# Patient Record
Sex: Male | Born: 1950 | Race: White | Hispanic: No | Marital: Married | Smoking: Former smoker
Health system: Southern US, Community
[De-identification: ages and names within clinical notes are randomized; demographics above are authoritative.]

## PROBLEM LIST (undated history)

## (undated) DIAGNOSIS — F32A Depression, unspecified: Secondary | ICD-10-CM

## (undated) DIAGNOSIS — C801 Malignant (primary) neoplasm, unspecified: Secondary | ICD-10-CM

## (undated) DIAGNOSIS — E119 Type 2 diabetes mellitus without complications: Secondary | ICD-10-CM

## (undated) DIAGNOSIS — Z8719 Personal history of other diseases of the digestive system: Secondary | ICD-10-CM

## (undated) DIAGNOSIS — F419 Anxiety disorder, unspecified: Secondary | ICD-10-CM

## (undated) DIAGNOSIS — I219 Acute myocardial infarction, unspecified: Secondary | ICD-10-CM

## (undated) DIAGNOSIS — G4730 Sleep apnea, unspecified: Secondary | ICD-10-CM

## (undated) DIAGNOSIS — G4733 Obstructive sleep apnea (adult) (pediatric): Secondary | ICD-10-CM

## (undated) DIAGNOSIS — I2510 Atherosclerotic heart disease of native coronary artery without angina pectoris: Secondary | ICD-10-CM

## (undated) DIAGNOSIS — K219 Gastro-esophageal reflux disease without esophagitis: Secondary | ICD-10-CM

## (undated) DIAGNOSIS — E039 Hypothyroidism, unspecified: Secondary | ICD-10-CM

## (undated) DIAGNOSIS — F329 Major depressive disorder, single episode, unspecified: Secondary | ICD-10-CM

## (undated) DIAGNOSIS — G709 Myoneural disorder, unspecified: Secondary | ICD-10-CM

## (undated) DIAGNOSIS — Z87442 Personal history of urinary calculi: Secondary | ICD-10-CM

## (undated) DIAGNOSIS — M1990 Unspecified osteoarthritis, unspecified site: Secondary | ICD-10-CM

## (undated) DIAGNOSIS — I10 Essential (primary) hypertension: Secondary | ICD-10-CM

## (undated) HISTORY — PX: NASAL SINUS SURGERY: SHX719

## (undated) HISTORY — PX: CORONARY ANGIOPLASTY: SHX604

## (undated) HISTORY — PX: EYE SURGERY: SHX253

## (undated) HISTORY — PX: CORONARY ANGIOPLASTY WITH STENT PLACEMENT: SHX49

---

## 2003-02-15 HISTORY — PX: THYROIDECTOMY, PARTIAL: SHX18

## 2011-02-15 DIAGNOSIS — G4730 Sleep apnea, unspecified: Secondary | ICD-10-CM

## 2011-02-15 HISTORY — DX: Sleep apnea, unspecified: G47.30

## 2015-02-15 HISTORY — PX: SHOULDER ARTHROSCOPY: SHX128

## 2015-02-15 HISTORY — PX: CARPAL TUNNEL RELEASE: SHX101

## 2016-05-12 ENCOUNTER — Other Ambulatory Visit: Payer: Self-pay | Admitting: Neurosurgery

## 2016-05-12 DIAGNOSIS — M5412 Radiculopathy, cervical region: Secondary | ICD-10-CM

## 2016-05-17 ENCOUNTER — Ambulatory Visit
Admission: RE | Admit: 2016-05-17 | Discharge: 2016-05-17 | Disposition: A | Discharge status: Home / Self Care | Payer: Medicare Other | Source: Ambulatory Visit | Attending: Neurosurgery | Admitting: Neurosurgery

## 2016-05-17 DIAGNOSIS — M5412 Radiculopathy, cervical region: Secondary | ICD-10-CM

## 2016-06-07 ENCOUNTER — Other Ambulatory Visit: Payer: Self-pay | Admitting: Neurosurgery

## 2016-06-15 ENCOUNTER — Encounter (HOSPITAL_COMMUNITY)
Admission: RE | Admit: 2016-06-15 | Discharge: 2016-06-15 | Disposition: A | Discharge status: Home / Self Care | Payer: Medicare Other | Source: Ambulatory Visit | Attending: Neurosurgery | Admitting: Neurosurgery

## 2016-06-15 ENCOUNTER — Encounter (HOSPITAL_COMMUNITY): Payer: Self-pay

## 2016-06-15 DIAGNOSIS — Z87442 Personal history of urinary calculi: Secondary | ICD-10-CM | POA: Diagnosis not present

## 2016-06-15 DIAGNOSIS — I2510 Atherosclerotic heart disease of native coronary artery without angina pectoris: Secondary | ICD-10-CM | POA: Diagnosis not present

## 2016-06-15 DIAGNOSIS — G4730 Sleep apnea, unspecified: Secondary | ICD-10-CM | POA: Insufficient documentation

## 2016-06-15 DIAGNOSIS — G709 Myoneural disorder, unspecified: Secondary | ICD-10-CM | POA: Insufficient documentation

## 2016-06-15 DIAGNOSIS — I252 Old myocardial infarction: Secondary | ICD-10-CM | POA: Diagnosis not present

## 2016-06-15 DIAGNOSIS — F419 Anxiety disorder, unspecified: Secondary | ICD-10-CM | POA: Insufficient documentation

## 2016-06-15 DIAGNOSIS — Z01812 Encounter for preprocedural laboratory examination: Secondary | ICD-10-CM | POA: Insufficient documentation

## 2016-06-15 DIAGNOSIS — I10 Essential (primary) hypertension: Secondary | ICD-10-CM | POA: Insufficient documentation

## 2016-06-15 DIAGNOSIS — F329 Major depressive disorder, single episode, unspecified: Secondary | ICD-10-CM | POA: Insufficient documentation

## 2016-06-15 DIAGNOSIS — K219 Gastro-esophageal reflux disease without esophagitis: Secondary | ICD-10-CM | POA: Insufficient documentation

## 2016-06-15 HISTORY — DX: Essential (primary) hypertension: I10

## 2016-06-15 HISTORY — DX: Unspecified osteoarthritis, unspecified site: M19.90

## 2016-06-15 HISTORY — DX: Gastro-esophageal reflux disease without esophagitis: K21.9

## 2016-06-15 HISTORY — DX: Major depressive disorder, single episode, unspecified: F32.9

## 2016-06-15 HISTORY — DX: Sleep apnea, unspecified: G47.30

## 2016-06-15 HISTORY — DX: Personal history of other diseases of the digestive system: Z87.19

## 2016-06-15 HISTORY — DX: Atherosclerotic heart disease of native coronary artery without angina pectoris: I25.10

## 2016-06-15 HISTORY — DX: Depression, unspecified: F32.A

## 2016-06-15 HISTORY — DX: Myoneural disorder, unspecified: G70.9

## 2016-06-15 HISTORY — DX: Anxiety disorder, unspecified: F41.9

## 2016-06-15 HISTORY — DX: Personal history of urinary calculi: Z87.442

## 2016-06-15 HISTORY — DX: Acute myocardial infarction, unspecified: I21.9

## 2016-06-15 LAB — CBC
HCT: 40 % (ref 39.0–52.0)
Hemoglobin: 13.4 g/dL (ref 13.0–17.0)
MCH: 29.1 pg (ref 26.0–34.0)
MCHC: 33.5 g/dL (ref 30.0–36.0)
MCV: 87 fL (ref 78.0–100.0)
PLATELETS: 211 10*3/uL (ref 150–400)
RBC: 4.6 MIL/uL (ref 4.22–5.81)
RDW: 13.4 % (ref 11.5–15.5)
WBC: 11 10*3/uL — AB (ref 4.0–10.5)

## 2016-06-15 LAB — SURGICAL PCR SCREEN
MRSA, PCR: NEGATIVE
STAPHYLOCOCCUS AUREUS: NEGATIVE

## 2016-06-15 LAB — TYPE AND SCREEN
ABO/RH(D): A POS
Antibody Screen: NEGATIVE

## 2016-06-15 LAB — BASIC METABOLIC PANEL
Anion gap: 8 (ref 5–15)
BUN: 17 mg/dL (ref 6–20)
CALCIUM: 9.3 mg/dL (ref 8.9–10.3)
CHLORIDE: 104 mmol/L (ref 101–111)
CO2: 23 mmol/L (ref 22–32)
CREATININE: 0.81 mg/dL (ref 0.61–1.24)
GFR calc Af Amer: >60 mL/min (ref >60)
Glucose, Bld: 119 mg/dL — ABNORMAL HIGH (ref 65–99)
Potassium: 3.9 mmol/L (ref 3.5–5.1)
SODIUM: 135 mmol/L (ref 135–145)

## 2016-06-15 LAB — GLUCOSE, CAPILLARY: Glucose-Capillary: 154 mg/dL — ABNORMAL HIGH (ref 65–99)

## 2016-06-15 LAB — ABO/RH: ABO/RH(D): A POS

## 2016-06-15 NOTE — Pre-Procedure Instructions (Signed)
Douglas Aguirre  06/15/2016      [facility] - [place], [place] - [address] [place] [address] Phone: [phone] Fax: [phone]    Your procedure is scheduled on 06/21/2016- TUESDAY  Report to [facility] Admitting at 11:30 A.M.  Call this number if you have problems the morning of surgery:  [phone]   STOP GOODY'S POWDER, follow instructions for holding Plavix     Remember:   Do not eat food or drink liquids after midnight.  On Monday   Take these medicines the morning of surgery with A SIP OF WATER :PRILOSEC, Levothryroxine, Carvedilol, Sertraline & may take pain medicine if needed    Do not wear jewelry   Do not wear lotions, powders, or perfumes, or deoderant.              Men may shave face and neck.   Do not bring valuables to the hospital.   [facility] is not responsible for any belongings or valuables.  Contacts, dentures or bridgework may not be worn into surgery.  Leave your suitcase in the car.  After surgery it may be brought to your room.  For patients admitted to the hospital, discharge time will be determined by your treatment team.  Patients discharged the day of surgery will not be allowed to drive home.   Name and phone number of your driver:   Wife   Special instructions:     How to Manage Your Diabetes Before and After Surgery  Why is it important to control my blood sugar before and after surgery? . Improving blood sugar levels before and after surgery helps healing and can limit problems. . A way of improving blood sugar control is eating a healthy diet by: o  Eating less sugar and carbohydrates o  Increasing activity/exercise o  Talking with your doctor about reaching your blood sugar goals . High blood sugars (greater than 180 mg/dL) can raise your risk of infections and slow your recovery, so you will need to focus on controlling your diabetes during the weeks before surgery. . Make  sure that the doctor who takes care of your diabetes knows about your planned surgery including the date and location.  How do I manage my blood sugar before surgery? . Check your blood sugar at least 4 times a day, starting 2 days before surgery, to make sure that the level is not too high or low. o Check your blood sugar the morning of your surgery when you wake up and every 2 hours until you get to the Short Stay unit. . If your blood sugar is less than 70 mg/dL, you will need to treat for low blood sugar: o Do not take insulin. o Treat a low blood sugar (less than 70 mg/dL) with  cup of clear juice (cranberry or apple), 4 glucose tablets, OR glucose gel. o Recheck blood sugar in 15 minutes after treatment (to make sure it is greater than 70 mg/dL). If your blood sugar is not greater than 70 mg/dL on recheck, call [phone] for further instructions. . Report your blood sugar to the short stay nurse when you get to Short Stay.  . If you are admitted to the hospital after surgery: o Your blood sugar will be checked by the staff and you will probably be given insulin after surgery (instead of oral diabetes medicines) to make sure you have good blood sugar levels. o The goal  for blood sugar control after surgery is 80-180 mg/dL.              WHAT DO I DO ABOUT MY DIABETES MEDICATION?  DO NOT TAKE EVENING DOSE OF Glimerpiride on 06/20/2016.  . Do not take oral diabetes medicines (pills) the morning of surgery.  . .       . HE MORNING OF SURGERY, take __50___________ units of _Levemir_________insulin.  . The day of surgery, do not take other diabetes injectables, including Byetta (exenatide), Bydureon (exenatide ER), Victoza (liraglutide), or Trulicity (dulaglutide).  . If your CBG is greater than 220 mg/dL, you may take  of your sliding scale (correction) dose of insulin.  Other Instructions:  Special Instructions: [place] - Preparing for Surgery  Before surgery, you  can play an important role.  Because skin is not sterile, your skin needs to be as free of germs as possible.  You can reduce the number of germs on you skin by washing with CHG (chlorahexidine gluconate) soap before surgery.  CHG is an antiseptic cleaner which kills germs and bonds with the skin to continue killing germs even after washing.  Please DO NOT use if you have an allergy to CHG or antibacterial soaps.  If your skin becomes reddened/irritated stop using the CHG and inform your nurse when you arrive at Short Stay.  Do not shave (including legs and underarms) for at least 48 hours prior to the first CHG shower.  You may shave your face.  Please follow these instructions carefully:   1.  Shower with CHG Soap the night before surgery and the  morning of Surgery.  2.  If you choose to wash your hair, wash your hair first as usual with your  normal shampoo.  3.  After you shampoo, rinse your hair and body thoroughly to remove the  Shampoo.  4.  Use CHG as you would any other liquid soap.  You can apply chg directly to the skin and wash gently with scrungie or a clean washcloth.  5.  Apply the CHG Soap to your body ONLY FROM THE NECK DOWN.    Do not use on open wounds or open sores.  Avoid contact with your eyes, ears, mouth and genitals (private parts).  Wash genitals (private parts)   with your normal soap.  6.  Wash thoroughly, paying special attention to the area where your surgery will be performed.  7.  Thoroughly rinse your body with warm water from the neck down.  8.  DO NOT shower/wash with your normal soap after using and rinsing off   the CHG Soap.  9.  Pat yourself dry with a clean towel.            10.  Wear clean pajamas.            11.  Place clean sheets on your bed the night of your first shower and do not sleep with pets.  Day of Surgery  Do not apply any lotions/deodorants the morning of surgery.  Please wear clean clothes to the hospital/surgery  center.          Patient Signature:  Date:   Nurse Signature:  Date:   Reviewed and Endorsed by [facility] Patient Education Committee, August 2015  Please read over the following fact sheets that you were given. Pain Booklet, Coughing and Deep Breathing, MRSA Information and Surgical Site Infection Prevention

## 2016-06-15 NOTE — Progress Notes (Signed)
Last OV- Dr. Betsy Pries- cardio- 08/2015 & he has remarked that he doesn't need to be seen again  before surgery.  Have faxed a request to Dr. Betsy Pries to forward last EKG, Stress/echo & surgical clearnace. Pt. Also see Dr. Byrd Hesselbach in [place] for PCP.  Pt. Reports that aside from his pain in his hands, L arm ( same as what it has been )  & neck, he has no complaints; denies all chest complaints.

## 2016-06-16 LAB — HEMOGLOBIN A1C
HEMOGLOBIN A1C: 9.4 % — AB (ref 4.8–5.6)
Mean Plasma Glucose: 223 mg/dL

## 2016-06-16 NOTE — Progress Notes (Addendum)
Anesthesia Chart Review: Patient is a 66 year old male scheduled for C6-7 ACDF, left carpal tunnel release on 06/21/2016 by Dr. Venetia MaxonStern. 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   - PCP is Dr. Adonis HugueninMichael Waters in [place], [place]. - Cardiologist is Dr. Daryel NovemberGary Miller at [facility]. Last visit 08/19/15. Dr. Hyacinth MeekerMiller was contacted for surgical clearance. At first there was question on whether or not he would need a stress test, but after speaking with patient and reviewing stress and echo for < 1 year ago, he approved surgery as scheduled--letter signed on 06/09/16. He did recommend that patient stay on ASA, but could hold Plavix for 7 days prior to surgery.   Meds include Xanax, aspirin 81 mg, Goody's extra strength, Coreg, Plavix, Lofibra, Amaryl, Norco, Levemir, levothyroxine, nitroglycerin, Prilosec, ramipril, Zoloft, Zocor, Janumet, trazodone, domperidone, Victoza.   BP (!) 105/52   Pulse 79   Temp 36.6 C   Resp 20   Ht 5\' 10"  (1.778 m)   Wt 251 lb 4.8 oz (114 kg)   SpO2 99%   BMI 36.06 kg/m   Last EKG received from Dr. Hyacinth MeekerMiller was from 08/15/14. We do not have the tracing from his 07/2015 stress test, so he will need an updated EKG on the day of surgery.  Nuclear stress test 07/20/15 (Dr. Hyacinth MeekerMiller): Impression: Negative electrical portion of the test. On the Cardiolite portion, there were no significant wall motion abnormality seen. The summed rest score was 2, the summed stress score was 0. There were no reversible ischemic perfusion defects. EF 46%. Normal LV contraction. This would be a low risk scan.   Echo 07/15/15 (Dr. Hyacinth MeekerMiller): Impressions: 1. LA is dilated. 2.  RV/LV or dilated. 3. Mild LVH. 4. EF 55-60%.  Cardiac Cath 09/22/06 ([facility]): Normal LV contractility with EF 60-65%. LM was normal. Proximal CX was normal.  OM1 was normal. Small distal CX had 75%.  This led into a small second circumflex marginal that had been seen on prior catheterization. Mid LAD now with 75% to 95%, increased for 25-50%. LAD had 25% stenosis further down and 25-50% in the mid to distal vessel. Proximal D1 had 25%. Ostial D2 25%. These were not large vessels. Additionally on the circumflex system, an obtuse marginal branch had 25% stenosis. Proximal RCA 25%. Mid RCA 25%. Distally the vessel had been stented in the posterolateral vessel. The stent appeared to be patent except in the midsegment of the stent there was a 75% restenosis.  PCI:  1. PCI/Xience DES was placed in the mid LAD. 2. PCI/Xience DES was placed in the previously placed stent in the distal RCA.   Preoperative labs. Cr 0.81. WBC 11.0. H/H 13.4/40.0. A1c 9.4, consistent with average glucose 223. T&S done. He reports home CBGs run ~ 150-170. We discussed that a fasting CBG > 200 could delay or cancel his surgery and that poorly controlled DM can place him at increased risk for post-operative infection. A1c result also called to Shanda BumpsJessica at Dr. Fredrich BirksStern's office--Dr. Venetia MaxonStern can contact patient if he has any concerns.   Patient continues to deny any CV symptoms. He confirmed that Plavix is now on hold. He will get a fasting CBG on arrival.  If no acute change and CBG result acceptable then I would anticipate that he could proceed as planned from an anesthesia standpoint.   Velna Ochs [facility] Short Stay Center/Anesthesiology Phone [phone] 06/17/2016 9:43 AM

## 2016-06-17 ENCOUNTER — Encounter (HOSPITAL_COMMUNITY): Payer: Self-pay

## 2016-06-20 ENCOUNTER — Encounter (HOSPITAL_COMMUNITY): Payer: Self-pay | Admitting: Anesthesiology

## 2016-06-20 NOTE — Anesthesia Preprocedure Evaluation (Addendum)
Anesthesia Evaluation  Patient identified by MRN, date of birth, ID band Patient awake    Reviewed: Allergy & Precautions, H&P , NPO status , Patient's Chart, lab work & pertinent test results  Airway Mallampati: III  TM Distance: >3 FB Neck ROM: Full    Dental no notable dental hx. (+) Upper Dentures, Edentulous Lower, Dental Advisory Given   Pulmonary sleep apnea and Continuous Positive Airway Pressure Ventilation , former smoker,    Pulmonary exam normal breath sounds clear to auscultation       Cardiovascular Exercise Tolerance: Good hypertension, Pt. on medications and Pt. on home beta blockers + CAD, + Past MI and + Cardiac Stents   Rhythm:Regular Rate:Normal     Neuro/Psych Anxiety Depression negative neurological ROS     GI/Hepatic Neg liver ROS, GERD  Medicated and Controlled,  Endo/Other  Morbid obesity  Renal/GU negative Renal ROS  negative genitourinary   Musculoskeletal  (+) Arthritis , Osteoarthritis,    Abdominal   Peds  Hematology negative hematology ROS (+)   Anesthesia Other Findings   Reproductive/Obstetrics negative OB ROS                            Anesthesia Physical Anesthesia Plan  ASA: III  Anesthesia Plan: General   Post-op Pain Management:    Induction: Intravenous  Airway Management Planned: Oral ETT  Additional Equipment:   Intra-op Plan:   Post-operative Plan: Extubation in OR  Informed Consent: I have reviewed the patients History and Physical, chart, labs and discussed the procedure including the risks, benefits and alternatives for the proposed anesthesia with the patient or authorized representative who has indicated his/her understanding and acceptance.   Dental advisory given  Plan Discussed with: CRNA  Anesthesia Plan Comments:         Anesthesia Quick Evaluation

## 2016-06-20 NOTE — H&P (Signed)
Patient ID:   [id number] Patient: Douglas Aguirre  Date of Birth: 08-31-1950 Visit Type: Office Visit   Date: 06/06/2016 10:00 AM Provider: Danae Orleans. [name] MD   This 66 year old male presents for neck pain.  History of Present Illness: 1.  neck pain    Douglas Aguirre, 66 year old male returns to review his cervical MRI. He reports left UE pain and some right hand discomfort. Some pain in his left shoulder and weakness in his triceps. He had a previous shoulder surgery but notes little improvement.   His MRI reveals a ruptured disc at C6-7 on the left and degeneration and foraminal narrowing at C3-4. Nerve conduction test shows recurrent carpal tunnel in right hand and carpal tunnel on the left.  On confrontational testing, positive tinel's sign on the right but negative on the left, left tricep weakness 4/5 but difficult to asses due to patient's shoulder and neck pain.  MRI 05/17/2016 Moderate left foraminal encroachment at C3-4 due to spurring. Mild left foraminal narrowing C4-5. Moderate to large osteophyte on the left at C6-7 with marked left foraminal encroachment and mild spinal stenosis.       PAST MEDICAL HISTORY, SURGICAL HISTORY, FAMILY HISTORY, SOCIAL HISTORY AND REVIEW OF SYSTEMS I have reviewed the patient's past medical, surgical, family and social history as well as the comprehensive review of systems as included on the [facility] history form dated 05/17/2016, which I have signed.   MEDICATIONS(added, continued or stopped this visit): Started Medication Directions Instruction Stopped   Lortab 5 mg-325 mg tablet take 1 tablet by oral route  every 8 hours as needed for pain    08/21/2013 nortriptyline 50 mg capsule take 1-2 capsule by oral route QHS - BID as needed (90 day supply)       ALLERGIES: Ingredient Reaction Medication Name Comment  NO KNOWN ALLERGIES     No known allergies.    Vitals Date Temp F BP Pulse Ht In Wt Lb BMI BSA  Pain Score  06/06/2016  130/72 67 69 253.4 37.42  5/10      IMPRESSION I recommend carpal tunnel release on the left side. MRI reveals degeneration and narrowing at C3-4 and ruptured disc at C6-7.   On confrontational testing, positive tinel's sign on the right but negative on the left, left tricep weakness 4/5 but difficult to asses due to patient's shoulder and neck pain.  I would advise ACDF at C6-7 and left hand carpal tunnel release. After these procedures I would recommend a redo right carpal tunnel release.   Patient is on an anti-coagulant, anti-inflammatory or supplement that may increase bleeding time. Patient advised to stop medicine prior to surgery.  Comments:  Provided his cardiologist approved, patient should stop Plavix 10 days preop.  Patient aware  Assessment/Plan # Detail Type Description   1. Assessment Cervicalgia (M54.2).       2. Assessment Bilateral carpal tunnel syndrome (G56.03).       3. Assessment HNP (herniated nucleus pulposus), cervical (M50.20).       4. Assessment Cervical radiculopathy (M54.12).         Pain Assessment/Treatment Pain Scale: 5/10. Method: Numeric Pain Intensity Scale. Location: shoulder. neck. Onset: 04/28/2014. Duration: varies. Quality: discomforting. Pain Assessment/Treatment follow-up plan of care: Patient taking medication as prescribed..  nurse education given. left carpal tunnel release and ACDF at C6-7 scheduled. then redo right carpal tunnel release  Orders: Diagnostic Procedures: Assessment Procedure  M54.12 Cervical Spine- Lateral  Provider:  Danae Orleans. [name] MD  06/06/2016 12:20 PM Dictation edited by: Philis Kendall    CC Providers: Carlota Raspberry [address] [place], [place] [address]-              Electronically signed by Danae Orleans. [name] MD on 06/07/2016 05:20 PM  Patient ID:   [id number] Patient: [name]  Date of Birth: 09-25-1950 Visit Type: Office  Visit   Date: 04/27/2016 03:30 PM Provider: Danae Orleans. [name] MD   This 66 year old male presents for Neck pain.  History of Present Illness: 1.  Neck pain    Douglas Aguirre, 66 year old retired male returns for evaluation of neck and right hand pain.  He reports increasing left-sided neck pain increasing over the last 2 years.  He reports no improvement in right hand pain since his carpal tunnel release by another physician in 2016.   Lortab 10/325 4-5/day for back pain  History:  MI 2008, ID DM, tinnitus, sleep apnea, "heart racing and some chest pain" x3 days (patient plans to notify his cardiologist, Dr. Hyacinth Meeker in [place]) Surgical history:  Thyroid surgery 2002, uvula plasty, heart stents 2008, right carpal tunnel release 2016, left rotator cuff x2 2017  X-rays on canopy        PAST MEDICAL/SURGICAL HISTORY   (Detailed)     Family History  (Detailed)   SOCIAL HISTORY  (Detailed) Preferred language is Unknown.   MARITAL STATUS/FAMILY/SOCIAL SUPPORT Currently married.   Smoking status: Former smoker.  TOBACCO CESSATION INFORMATION Date Order Status Description Code Tobacco Cessation Information  02/25/2013     Smoking cessation education    HOME ENVIRONMENT/SAFETY The patient has not fallen in the last year.        MEDICATIONS(added, continued or stopped this visit): Started Medication Directions Instruction Stopped   Lortab 5 mg-325 mg tablet take 1 tablet by oral route  every 8 hours as needed for pain    08/21/2013 nortriptyline 50 mg capsule take 1-2 capsule by oral route QHS - BID as needed (90 day supply)       ALLERGIES: Ingredient Reaction Medication Name Comment  NO KNOWN ALLERGIES     No known allergies.    Vitals Date Temp F BP Pulse Ht In Wt Lb BMI BSA Pain Score  04/27/2016  120/71 75 69 260 38.39  5/10     PHYSICAL EXAM General Level of Distress: no acute distress Overall Appearance: normal    Cardiovascular Cardiac: regular rate  and rhythm without murmur  Right Left  Carotid Pulses: normal normal  Respiratory Lungs: clear to auscultation  Neurological Orientation: normal Recent and Remote Memory: normal Attention Span and Concentration:   normal Language: normal Fund of Knowledge: normal  Right Left Sensation: normal normal Upper Extremity Coordination: normal normal  Lower Extremity Coordination: normal normal  Musculoskeletal Gait and Station: normal  Right Left Upper Extremity Muscle Strength: normal normal Lower Extremity Muscle Strength: normal normal Upper Extremity Muscle Tone:  normal normal Lower Extremity Muscle Tone: normal normal  Motor Strength Upper and lower extremity motor strength was tested in the clinically pertinent muscles.     Deep Tendon Reflexes  Right Left Biceps: normal normal Triceps: normal normal Brachioradialis: normal normal Patellar: normal normal Achilles: normal normal  Sensory Sensation was tested at C2 to T1.   Cranial Nerves II. Optic Nerve/Visual Fields: normal III. Oculomotor: normal IV. Trochlear: normal V. Trigeminal: normal VI. Abducens: normal VII. Facial: normal VIII. Acoustic/Vestibular: normal IX. Glossopharyngeal: normal X. Vagus: normal  XI. Spinal Accessory: normal XII. Hypoglossal: normal  Motor and other Tests Lhermittes: negative Rhomberg: negative Pronator drift: absent     Right Left Spurlings negative negative Hoffman's: normal normal Clonus: normal normal Babinski: normal normal SLR: negative negative Patrick's Pearlean Brownie(Faber): negative negative Toe Walk: normal normal Toe Lift: normal normal Heel Walk: normal normal Tinels Elbow: negative negative Tinels Wrist: negative negative Phalen: negative negative      IMPRESSION The patient comes in for evaluation of cervical and right hand pain.  He notes his 4th finger of his right hand becomes "cold."  He denies any numbness in his fingers or any symptoms with his left  hand.  On confrontational testing, he has negative Tinel's bilaterally, normal sensation to pinprick in hands bilaterally, negative Phalen's on the right, mildly positive Spurling's on the right, parascapular pain on the left and pain to palpation of right wrist.  X-rays show C4-5 joint arthritis.  He reports no improved CT symptoms of right hand after CT release.  Ordered cervical MRI and bilateral upper extremity nerve conduction test.  Follow up with me after to discuss.   Completed Orders (this encounter) Order Details Reason Side Interpretation Result Initial Treatment Date Region  Cervical Spine- AP/Lat/Flex/Ex      04/27/2016 All Levels to All Levels   Assessment/Plan # Detail Type Description   1. Assessment Cervical radiculopathy (M54.12).         Pain Assessment/Treatment Pain Scale: 5/10. Method: Numeric Pain Intensity Scale. Location: wrist. Onset: 04/28/2014. Duration: varies. Quality: aching, throbbing. Pain Assessment/Treatment follow-up plan of care: Patient is taking OTC pain relievers for relief..  Fall Risk Plan The patient has not fallen in the last year.  Ordered cervical MRI and nerve conduction test.  Follow up with me after to discuss.   Orders: Diagnostic Procedures: Assessment Procedure  M54.12 Cervical Spine- AP/Lat/Flex/Ex             Provider:  Danae OrleansJoseph D. Venetia MaxonStern MD  04/27/2016 04:41 PM Dictation edited by: Jorje GuildEmma Lewis    CC Providers: Carlota RaspberryEric Davidson [address] [place], [place] [id number]-              Electronically signed by Danae OrleansJoseph D. Venetia MaxonStern MD on 04/30/2016 03:29 PM

## 2016-06-21 ENCOUNTER — Ambulatory Visit (HOSPITAL_COMMUNITY)
Admission: RE | Admit: 2016-06-21 | Discharge: 2016-06-22 | Disposition: A | Discharge status: Home / Self Care | Payer: Medicare Other | Source: Ambulatory Visit | Attending: Neurosurgery | Admitting: Neurosurgery

## 2016-06-21 ENCOUNTER — Ambulatory Visit (HOSPITAL_COMMUNITY): Payer: Medicare Other | Admitting: Vascular Surgery

## 2016-06-21 ENCOUNTER — Inpatient Hospital Stay (HOSPITAL_COMMUNITY): Payer: Medicare Other

## 2016-06-21 ENCOUNTER — Ambulatory Visit (HOSPITAL_COMMUNITY): Payer: Medicare Other | Admitting: Anesthesiology

## 2016-06-21 ENCOUNTER — Encounter (HOSPITAL_COMMUNITY): Payer: Self-pay | Admitting: Anesthesiology

## 2016-06-21 ENCOUNTER — Encounter (HOSPITAL_COMMUNITY)
Admission: RE | Disposition: A | Discharge status: Home / Self Care | Payer: Self-pay | Source: Ambulatory Visit | Attending: Neurosurgery

## 2016-06-21 DIAGNOSIS — M2578 Osteophyte, vertebrae: Secondary | ICD-10-CM | POA: Diagnosis not present

## 2016-06-21 DIAGNOSIS — M50123 Cervical disc disorder at C6-C7 level with radiculopathy: Secondary | ICD-10-CM | POA: Insufficient documentation

## 2016-06-21 DIAGNOSIS — M4802 Spinal stenosis, cervical region: Secondary | ICD-10-CM | POA: Diagnosis not present

## 2016-06-21 DIAGNOSIS — K219 Gastro-esophageal reflux disease without esophagitis: Secondary | ICD-10-CM | POA: Insufficient documentation

## 2016-06-21 DIAGNOSIS — G4730 Sleep apnea, unspecified: Secondary | ICD-10-CM | POA: Diagnosis not present

## 2016-06-21 DIAGNOSIS — I10 Essential (primary) hypertension: Secondary | ICD-10-CM | POA: Insufficient documentation

## 2016-06-21 DIAGNOSIS — M1990 Unspecified osteoarthritis, unspecified site: Secondary | ICD-10-CM | POA: Diagnosis not present

## 2016-06-21 DIAGNOSIS — I2510 Atherosclerotic heart disease of native coronary artery without angina pectoris: Secondary | ICD-10-CM | POA: Insufficient documentation

## 2016-06-21 DIAGNOSIS — I252 Old myocardial infarction: Secondary | ICD-10-CM | POA: Insufficient documentation

## 2016-06-21 DIAGNOSIS — Z79899 Other long term (current) drug therapy: Secondary | ICD-10-CM | POA: Diagnosis not present

## 2016-06-21 DIAGNOSIS — Z419 Encounter for procedure for purposes other than remedying health state, unspecified: Secondary | ICD-10-CM

## 2016-06-21 DIAGNOSIS — Z6836 Body mass index (BMI) 36.0-36.9, adult: Secondary | ICD-10-CM | POA: Insufficient documentation

## 2016-06-21 DIAGNOSIS — Z955 Presence of coronary angioplasty implant and graft: Secondary | ICD-10-CM | POA: Diagnosis not present

## 2016-06-21 DIAGNOSIS — G5602 Carpal tunnel syndrome, left upper limb: Secondary | ICD-10-CM | POA: Insufficient documentation

## 2016-06-21 DIAGNOSIS — F418 Other specified anxiety disorders: Secondary | ICD-10-CM | POA: Diagnosis not present

## 2016-06-21 DIAGNOSIS — M5020 Other cervical disc displacement, unspecified cervical region: Secondary | ICD-10-CM

## 2016-06-21 HISTORY — DX: Other cervical disc displacement, unspecified cervical region: M50.20

## 2016-06-21 HISTORY — PX: ANTERIOR CERVICAL DECOMP/DISCECTOMY FUSION: SHX1161

## 2016-06-21 HISTORY — PX: CARPAL TUNNEL RELEASE: SHX101

## 2016-06-21 LAB — GLUCOSE, CAPILLARY
GLUCOSE-CAPILLARY: 247 mg/dL — AB (ref 65–99)
Glucose-Capillary: 228 mg/dL — ABNORMAL HIGH (ref 65–99)
Glucose-Capillary: 145 mg/dL — ABNORMAL HIGH (ref 65–99)
Glucose-Capillary: 130 mg/dL — ABNORMAL HIGH (ref 65–99)
Glucose-Capillary: 161 mg/dL — ABNORMAL HIGH (ref 65–99)

## 2016-06-21 SURGERY — ANTERIOR CERVICAL DECOMPRESSION/DISCECTOMY FUSION 1 LEVEL
Anesthesia: General | Site: Wrist

## 2016-06-21 MED ORDER — METHOCARBAMOL 500 MG PO TABS
500.0000 mg | ORAL_TABLET | Freq: Four times a day (QID) | ORAL | Status: DC | PRN
  Administered 2016-06-21 – 2016-06-22 (×4): 500 mg via ORAL
  Filled 2016-06-21 (×4): qty 1

## 2016-06-21 MED ORDER — LACTATED RINGERS IV SOLN
INTRAVENOUS | Status: DC
  Administered 2016-06-21 (×2): via INTRAVENOUS

## 2016-06-21 MED ORDER — ROCURONIUM BROMIDE 100 MG/10ML IV SOLN
INTRAVENOUS | Status: DC | PRN
  Administered 2016-06-21: 40 mg via INTRAVENOUS
  Administered 2016-06-21: 10 mg via INTRAVENOUS

## 2016-06-21 MED ORDER — LIDOCAINE-EPINEPHRINE 1 %-1:100000 IJ SOLN
INTRAMUSCULAR | Status: DC | PRN
  Administered 2016-06-21: 3.5 mL

## 2016-06-21 MED ORDER — KCL IN DEXTROSE-NACL 20-5-0.45 MEQ/L-%-% IV SOLN: INTRAVENOUS | Status: DC

## 2016-06-21 MED ORDER — 0.9 % SODIUM CHLORIDE (POUR BTL) OPTIME
TOPICAL | Status: DC | PRN
  Administered 2016-06-21: 1000 mL

## 2016-06-21 MED ORDER — PROPOFOL 10 MG/ML IV BOLUS
INTRAVENOUS | Status: DC | PRN
  Administered 2016-06-21: 140 mg via INTRAVENOUS

## 2016-06-21 MED ORDER — FENOFIBRATE 54 MG PO TABS
134.0000 mg | ORAL_TABLET | Freq: Every day | ORAL | Status: DC
  Administered 2016-06-22: 09:00:00 134 mg via ORAL
  Filled 2016-06-21: qty 1

## 2016-06-21 MED ORDER — THROMBIN 5000 UNITS EX SOLR
CUTANEOUS | Status: DC | PRN
  Administered 2016-06-21 (×2): 5000 [IU] via TOPICAL

## 2016-06-21 MED ORDER — GELATIN ABSORBABLE MT POWD
OROMUCOSAL | Status: DC | PRN
  Administered 2016-06-21: 5 mL via TOPICAL

## 2016-06-21 MED ORDER — SUGAMMADEX SODIUM 200 MG/2ML IV SOLN
INTRAVENOUS | Status: AC
  Filled 2016-06-21: qty 2

## 2016-06-21 MED ORDER — FENTANYL CITRATE (PF) 100 MCG/2ML IJ SOLN
INTRAMUSCULAR | Status: DC | PRN
  Administered 2016-06-21: 150 ug via INTRAVENOUS
  Administered 2016-06-21: 50 ug via INTRAVENOUS

## 2016-06-21 MED ORDER — DOCUSATE SODIUM 100 MG PO CAPS
100.0000 mg | ORAL_CAPSULE | Freq: Two times a day (BID) | ORAL | Status: DC
  Administered 2016-06-21 – 2016-06-22 (×2): 100 mg via ORAL
  Filled 2016-06-21 (×2): qty 1

## 2016-06-21 MED ORDER — DEXAMETHASONE SODIUM PHOSPHATE 10 MG/ML IJ SOLN
INTRAMUSCULAR | Status: AC
  Filled 2016-06-21: qty 1

## 2016-06-21 MED ORDER — RAMIPRIL 10 MG PO CAPS
10.0000 mg | ORAL_CAPSULE | Freq: Every day | ORAL | Status: DC
  Administered 2016-06-22: 10 mg via ORAL
  Filled 2016-06-21: qty 1

## 2016-06-21 MED ORDER — LINAGLIPTIN 5 MG PO TABS
5.0000 mg | ORAL_TABLET | Freq: Every day | ORAL | Status: DC
  Administered 2016-06-22: 5 mg via ORAL
  Filled 2016-06-21: qty 1

## 2016-06-21 MED ORDER — HYDROMORPHONE HCL 1 MG/ML IJ SOLN
INTRAMUSCULAR | Status: AC
  Filled 2016-06-21: qty 0.5

## 2016-06-21 MED ORDER — CEFAZOLIN SODIUM-DEXTROSE 2-4 GM/100ML-% IV SOLN
2.0000 g | INTRAVENOUS | Status: AC
  Administered 2016-06-21: 2 g via INTRAVENOUS
  Filled 2016-06-21: qty 100

## 2016-06-21 MED ORDER — ONDANSETRON HCL 4 MG/2ML IJ SOLN: 4.0000 mg | Freq: Four times a day (QID) | INTRAMUSCULAR | Status: DC | PRN

## 2016-06-21 MED ORDER — SODIUM CHLORIDE 0.9 % IV SOLN: 250.0000 mL | INTRAVENOUS | Status: DC

## 2016-06-21 MED ORDER — LEVOTHYROXINE SODIUM 100 MCG PO TABS
50.0000 ug | ORAL_TABLET | Freq: Every day | ORAL | Status: DC
  Administered 2016-06-22: 50 ug via ORAL
  Filled 2016-06-21: qty 1

## 2016-06-21 MED ORDER — INSULIN DETEMIR 100 UNIT/ML ~~LOC~~ SOLN
100.0000 [IU] | Freq: Every day | SUBCUTANEOUS | Status: DC
  Administered 2016-06-22: 100 [IU] via SUBCUTANEOUS
  Filled 2016-06-21: qty 1

## 2016-06-21 MED ORDER — THROMBIN 5000 UNITS EX SOLR
CUTANEOUS | Status: AC
  Filled 2016-06-21: qty 10000

## 2016-06-21 MED ORDER — METHOCARBAMOL 1000 MG/10ML IJ SOLN
500.0000 mg | Freq: Four times a day (QID) | INTRAVENOUS | Status: DC | PRN
  Filled 2016-06-21: qty 5

## 2016-06-21 MED ORDER — PANTOPRAZOLE SODIUM 40 MG PO TBEC
40.0000 mg | DELAYED_RELEASE_TABLET | Freq: Every day | ORAL | Status: DC
  Administered 2016-06-22: 40 mg via ORAL
  Filled 2016-06-21: qty 1

## 2016-06-21 MED ORDER — MENTHOL 3 MG MT LOZG: 1.0000 | LOZENGE | OROMUCOSAL | Status: DC | PRN

## 2016-06-21 MED ORDER — ONDANSETRON HCL 4 MG/2ML IJ SOLN
INTRAMUSCULAR | Status: AC
  Filled 2016-06-21: qty 2

## 2016-06-21 MED ORDER — INSULIN DETEMIR 100 UNIT/ML FLEXPEN
100.0000 [IU] | PEN_INJECTOR | Freq: Every day | SUBCUTANEOUS | Status: DC
Start: 2016-06-21 — End: 2016-06-21

## 2016-06-21 MED ORDER — NITROGLYCERIN 0.4 MG SL SUBL: 0.4000 mg | SUBLINGUAL_TABLET | SUBLINGUAL | Status: DC | PRN

## 2016-06-21 MED ORDER — SUGAMMADEX SODIUM 500 MG/5ML IV SOLN
INTRAVENOUS | Status: AC
  Filled 2016-06-21: qty 5

## 2016-06-21 MED ORDER — DEXTROSE 5 % IV SOLN: INTRAVENOUS | Status: DC | PRN

## 2016-06-21 MED ORDER — HYDROMORPHONE HCL 1 MG/ML IJ SOLN
0.2500 mg | INTRAMUSCULAR | Status: DC | PRN
  Administered 2016-06-21: 0.5 mg via INTRAVENOUS

## 2016-06-21 MED ORDER — THROMBIN 5000 UNITS EX SOLR
CUTANEOUS | Status: AC
  Filled 2016-06-21: qty 5000

## 2016-06-21 MED ORDER — ONDANSETRON HCL 4 MG PO TABS: 4.0000 mg | ORAL_TABLET | Freq: Four times a day (QID) | ORAL | Status: DC | PRN

## 2016-06-21 MED ORDER — HYDROCODONE-ACETAMINOPHEN 10-325 MG PO TABS
1.0000 | ORAL_TABLET | Freq: Every day | ORAL | Status: DC
  Administered 2016-06-21 (×2): 1 via ORAL
  Filled 2016-06-21 (×3): qty 1

## 2016-06-21 MED ORDER — FENTANYL CITRATE (PF) 250 MCG/5ML IJ SOLN
INTRAMUSCULAR | Status: AC
Start: 2016-06-21 — End: 2016-06-21
  Filled 2016-06-21: qty 5

## 2016-06-21 MED ORDER — ALPRAZOLAM 0.5 MG PO TABS
0.5000 mg | ORAL_TABLET | Freq: Two times a day (BID) | ORAL | Status: DC | PRN
  Administered 2016-06-22: 0.5 mg via ORAL
  Filled 2016-06-21: qty 1

## 2016-06-21 MED ORDER — SUCCINYLCHOLINE CHLORIDE 20 MG/ML IJ SOLN
INTRAMUSCULAR | Status: DC | PRN
  Administered 2016-06-21: 100 mg via INTRAVENOUS

## 2016-06-21 MED ORDER — PANTOPRAZOLE SODIUM 40 MG IV SOLR: 40.0000 mg | Freq: Every day | INTRAVENOUS | Status: DC

## 2016-06-21 MED ORDER — INSULIN ASPART 100 UNIT/ML ~~LOC~~ SOLN
0.0000 [IU] | Freq: Three times a day (TID) | SUBCUTANEOUS | Status: DC
  Administered 2016-06-21: 3 [IU] via SUBCUTANEOUS
  Administered 2016-06-22: 5 [IU] via SUBCUTANEOUS

## 2016-06-21 MED ORDER — BUPIVACAINE HCL (PF) 0.5 % IJ SOLN
INTRAMUSCULAR | Status: DC | PRN
  Administered 2016-06-21: 3.5 mL

## 2016-06-21 MED ORDER — HEMOSTATIC AGENTS (NO CHARGE) OPTIME
TOPICAL | Status: DC | PRN
  Administered 2016-06-21: 1 via TOPICAL

## 2016-06-21 MED ORDER — INSULIN ASPART 100 UNIT/ML ~~LOC~~ SOLN
4.0000 [IU] | Freq: Three times a day (TID) | SUBCUTANEOUS | Status: DC
  Administered 2016-06-21 – 2016-06-22 (×2): 4 [IU] via SUBCUTANEOUS

## 2016-06-21 MED ORDER — POLYETHYLENE GLYCOL 3350 17 G PO PACK: 17.0000 g | PACK | Freq: Every day | ORAL | Status: DC | PRN

## 2016-06-21 MED ORDER — ACETAMINOPHEN 650 MG RE SUPP: 650.0000 mg | RECTAL | Status: DC | PRN

## 2016-06-21 MED ORDER — SERTRALINE HCL 50 MG PO TABS
50.0000 mg | ORAL_TABLET | Freq: Every day | ORAL | Status: DC
  Administered 2016-06-21: 50 mg via ORAL
  Filled 2016-06-21: qty 1

## 2016-06-21 MED ORDER — LIDOCAINE HCL 1 % IJ SOLN
INTRAMUSCULAR | Status: AC
  Filled 2016-06-21: qty 20

## 2016-06-21 MED ORDER — PHENOL 1.4 % MT LIQD: 1.0000 | OROMUCOSAL | Status: DC | PRN

## 2016-06-21 MED ORDER — PHENYLEPHRINE HCL 10 MG/ML IJ SOLN
INTRAMUSCULAR | Status: DC | PRN
  Administered 2016-06-21: 120 ug via INTRAVENOUS

## 2016-06-21 MED ORDER — INSULIN ASPART 100 UNIT/ML ~~LOC~~ SOLN
0.0000 [IU] | Freq: Every day | SUBCUTANEOUS | Status: DC
  Administered 2016-06-21: 2 [IU] via SUBCUTANEOUS

## 2016-06-21 MED ORDER — LIDOCAINE HCL (CARDIAC) 20 MG/ML IV SOLN
INTRAVENOUS | Status: DC | PRN
  Administered 2016-06-21: 60 mg via INTRAVENOUS

## 2016-06-21 MED ORDER — ADULT MULTIVITAMIN W/MINERALS CH
1.0000 | ORAL_TABLET | Freq: Every day | ORAL | Status: DC
  Administered 2016-06-22: 1 via ORAL
  Filled 2016-06-21: qty 1

## 2016-06-21 MED ORDER — FLEET ENEMA 7-19 GM/118ML RE ENEM: 1.0000 | ENEMA | Freq: Once | RECTAL | Status: DC | PRN

## 2016-06-21 MED ORDER — LIDOCAINE-EPINEPHRINE 1 %-1:100000 IJ SOLN
INTRAMUSCULAR | Status: AC
  Filled 2016-06-21: qty 1

## 2016-06-21 MED ORDER — DEXAMETHASONE SODIUM PHOSPHATE 10 MG/ML IJ SOLN
INTRAMUSCULAR | Status: DC | PRN
  Administered 2016-06-21: 10 mg via INTRAVENOUS

## 2016-06-21 MED ORDER — GLIMEPIRIDE 2 MG PO TABS
4.0000 mg | ORAL_TABLET | Freq: Two times a day (BID) | ORAL | Status: DC
  Administered 2016-06-22: 4 mg via ORAL
  Filled 2016-06-21: qty 2

## 2016-06-21 MED ORDER — ASPIRIN EC 81 MG PO TBEC
81.0000 mg | DELAYED_RELEASE_TABLET | Freq: Every day | ORAL | Status: DC
Start: 2016-06-22 — End: 2016-06-22
  Administered 2016-06-22: 81 mg via ORAL
  Filled 2016-06-21: qty 1

## 2016-06-21 MED ORDER — SITAGLIPTIN PHOS-METFORMIN HCL 50-1000 MG PO TABS: 1.0000 | ORAL_TABLET | Freq: Two times a day (BID) | ORAL | Status: DC

## 2016-06-21 MED ORDER — MIDAZOLAM HCL 5 MG/5ML IJ SOLN
INTRAMUSCULAR | Status: DC | PRN
  Administered 2016-06-21: 2 mg via INTRAVENOUS

## 2016-06-21 MED ORDER — SUGAMMADEX SODIUM 500 MG/5ML IV SOLN
INTRAVENOUS | Status: DC | PRN
  Administered 2016-06-21: 228 mg via INTRAVENOUS

## 2016-06-21 MED ORDER — ONDANSETRON HCL 4 MG/2ML IJ SOLN
INTRAMUSCULAR | Status: DC | PRN
  Administered 2016-06-21: 4 mg via INTRAVENOUS

## 2016-06-21 MED ORDER — HYDROCODONE-ACETAMINOPHEN 5-325 MG PO TABS: 1.0000 | ORAL_TABLET | ORAL | Status: DC | PRN

## 2016-06-21 MED ORDER — ZOLPIDEM TARTRATE 5 MG PO TABS: 5.0000 mg | ORAL_TABLET | Freq: Every evening | ORAL | Status: DC | PRN

## 2016-06-21 MED ORDER — ACETAMINOPHEN 325 MG PO TABS: 650.0000 mg | ORAL_TABLET | ORAL | Status: DC | PRN

## 2016-06-21 MED ORDER — LIDOCAINE 2% (20 MG/ML) 5 ML SYRINGE
INTRAMUSCULAR | Status: AC
  Filled 2016-06-21: qty 5

## 2016-06-21 MED ORDER — DEXTROSE 5 % IV SOLN
INTRAVENOUS | Status: DC | PRN
  Administered 2016-06-21: 50 ug/min via INTRAVENOUS
  Administered 2016-06-21: 10 ug/min via INTRAVENOUS

## 2016-06-21 MED ORDER — CARVEDILOL 6.25 MG PO TABS
6.2500 mg | ORAL_TABLET | Freq: Two times a day (BID) | ORAL | Status: DC
  Administered 2016-06-21 – 2016-06-22 (×2): 6.25 mg via ORAL
  Filled 2016-06-21 (×2): qty 1

## 2016-06-21 MED ORDER — LIRAGLUTIDE 18 MG/3ML ~~LOC~~ SOPN: 0.6000 mg | PEN_INJECTOR | Freq: Every day | SUBCUTANEOUS | Status: DC

## 2016-06-21 MED ORDER — MORPHINE SULFATE (PF) 4 MG/ML IV SOLN
2.0000 mg | INTRAVENOUS | Status: DC | PRN
  Administered 2016-06-21 – 2016-06-22 (×4): 4 mg via INTRAVENOUS
  Filled 2016-06-21 (×4): qty 1

## 2016-06-21 MED ORDER — SODIUM CHLORIDE 0.9% FLUSH: 3.0000 mL | Freq: Two times a day (BID) | INTRAVENOUS | Status: DC

## 2016-06-21 MED ORDER — BISACODYL 10 MG RE SUPP: 10.0000 mg | Freq: Every day | RECTAL | Status: DC | PRN

## 2016-06-21 MED ORDER — LIDOCAINE HCL (PF) 1 % IJ SOLN
INTRAMUSCULAR | Status: DC | PRN
Start: 2016-06-21 — End: 2016-06-21
  Administered 2016-06-21: 6 mL

## 2016-06-21 MED ORDER — SODIUM CHLORIDE 0.9% FLUSH: 3.0000 mL | INTRAVENOUS | Status: DC | PRN

## 2016-06-21 MED ORDER — CEFAZOLIN SODIUM-DEXTROSE 2-4 GM/100ML-% IV SOLN
2.0000 g | Freq: Three times a day (TID) | INTRAVENOUS | Status: AC
  Administered 2016-06-21 – 2016-06-22 (×2): 2 g via INTRAVENOUS
  Filled 2016-06-21 (×2): qty 100

## 2016-06-21 MED ORDER — SIMVASTATIN 20 MG PO TABS
40.0000 mg | ORAL_TABLET | Freq: Every day | ORAL | Status: DC
  Administered 2016-06-21: 40 mg via ORAL
  Filled 2016-06-21: qty 2

## 2016-06-21 MED ORDER — BUPIVACAINE HCL (PF) 0.5 % IJ SOLN
INTRAMUSCULAR | Status: AC
  Filled 2016-06-21: qty 30

## 2016-06-21 MED ORDER — ASPIRIN-ACETAMINOPHEN-CAFFEINE 500-325-65 MG PO PACK: 1.0000 | PACK | Freq: Every day | ORAL | Status: DC | PRN

## 2016-06-21 MED ORDER — PROPOFOL 10 MG/ML IV BOLUS
INTRAVENOUS | Status: AC
  Filled 2016-06-21: qty 20

## 2016-06-21 MED ORDER — METFORMIN HCL 500 MG PO TABS
1000.0000 mg | ORAL_TABLET | Freq: Two times a day (BID) | ORAL | Status: DC
  Administered 2016-06-21 – 2016-06-22 (×2): 1000 mg via ORAL
  Filled 2016-06-21 (×2): qty 2

## 2016-06-21 MED ORDER — MIDAZOLAM HCL 2 MG/2ML IJ SOLN
INTRAMUSCULAR | Status: AC
  Filled 2016-06-21: qty 2

## 2016-06-21 MED ORDER — TRAZODONE HCL 100 MG PO TABS
100.0000 mg | ORAL_TABLET | Freq: Every day | ORAL | Status: DC
  Administered 2016-06-21: 100 mg via ORAL
  Filled 2016-06-21: qty 1

## 2016-06-21 SURGICAL SUPPLY — 90 items
BANDAGE GAUZE 4  KLING STR (GAUZE/BANDAGES/DRESSINGS) ×4 IMPLANT
BASKET BONE COLLECTION (BASKET) ×3 IMPLANT
BIT DRILL 14X2.5XNS TI ANT (BIT) ×2 IMPLANT
BIT DRILL AVIATOR 14 (BIT) ×1
BIT DRILL AVIATOR 14MM (BIT) ×1
BIT DRILL NEURO 2X3.1 SFT TUCH (MISCELLANEOUS) ×2 IMPLANT
BIT DRL 14X2.5XNS TI ANT (BIT) ×2
BLADE SURG 15 STRL LF DISP TIS (BLADE) ×2 IMPLANT
BLADE SURG 15 STRL SS (BLADE) ×2
BLADE ULTRA TIP 2M (BLADE) ×4 IMPLANT
BNDG GAUZE ELAST 4 BULKY (GAUZE/BANDAGES/DRESSINGS) ×4 IMPLANT
BOWL SPATULA (MISCELLANEOUS) ×4 IMPLANT
BUR BARREL STRAIGHT FLUTE 4.0 (BURR) ×8 IMPLANT
CANISTER SUCT 3000ML PPV (MISCELLANEOUS) ×4 IMPLANT
CARTRIDGE OIL MAESTRO DRILL (MISCELLANEOUS) ×2 IMPLANT
CONT SPEC STER OR (MISCELLANEOUS) ×4 IMPLANT
CORDS BIPOLAR (ELECTRODE) ×4 IMPLANT
COVER MAYO STAND STRL (DRAPES) ×4 IMPLANT
DECANTER SPIKE VIAL GLASS SM (MISCELLANEOUS) ×4 IMPLANT
DERMABOND ADVANCED (GAUZE/BANDAGES/DRESSINGS) ×2
DERMABOND ADVANCED .7 DNX12 (GAUZE/BANDAGES/DRESSINGS) ×2 IMPLANT
DIFFUSER DRILL AIR PNEUMATIC (MISCELLANEOUS) ×4 IMPLANT
DRAPE EXTREMITY T 121X128X90 (DRAPE) ×4 IMPLANT
DRAPE HALF SHEET 40X57 (DRAPES) ×4 IMPLANT
DRAPE LAPAROTOMY 100X72 PEDS (DRAPES) ×4 IMPLANT
DRAPE MICROSCOPE LEICA (MISCELLANEOUS) ×4 IMPLANT
DRAPE POUCH INSTRU U-SHP 10X18 (DRAPES) ×4 IMPLANT
DRILL NEURO 2X3.1 SOFT TOUCH (MISCELLANEOUS) ×4
DRSG ADAPTIC 3X8 NADH LF (GAUZE/BANDAGES/DRESSINGS) ×4 IMPLANT
DRSG EMULSION OIL 3X3 NADH (GAUZE/BANDAGES/DRESSINGS) IMPLANT
DRSG OPSITE POSTOP 3X4 (GAUZE/BANDAGES/DRESSINGS) ×4 IMPLANT
DURAPREP 6ML APPLICATOR 50/CS (WOUND CARE) ×4 IMPLANT
ELECT COATED BLADE 2.86 ST (ELECTRODE) ×4 IMPLANT
ELECT REM PT RETURN 9FT ADLT (ELECTROSURGICAL) ×4
ELECTRODE REM PT RTRN 9FT ADLT (ELECTROSURGICAL) ×2 IMPLANT
GAUZE SPONGE 4X4 12PLY STRL (GAUZE/BANDAGES/DRESSINGS) ×4 IMPLANT
GAUZE SPONGE 4X4 16PLY XRAY LF (GAUZE/BANDAGES/DRESSINGS) ×4 IMPLANT
GLOVE BIO SURGEON STRL SZ8 (GLOVE) ×8 IMPLANT
GLOVE BIOGEL PI IND STRL 7.5 (GLOVE) ×2 IMPLANT
GLOVE BIOGEL PI IND STRL 8 (GLOVE) ×2 IMPLANT
GLOVE BIOGEL PI IND STRL 8.5 (GLOVE) ×4 IMPLANT
GLOVE BIOGEL PI INDICATOR 7.5 (GLOVE) ×2
GLOVE BIOGEL PI INDICATOR 8 (GLOVE) ×2
GLOVE BIOGEL PI INDICATOR 8.5 (GLOVE) ×4
GLOVE ECLIPSE 8.0 STRL XLNG CF (GLOVE) ×4 IMPLANT
GLOVE EXAM NITRILE LRG STRL (GLOVE) IMPLANT
GLOVE EXAM NITRILE XL STR (GLOVE) IMPLANT
GLOVE EXAM NITRILE XS STR PU (GLOVE) IMPLANT
GLOVE SS BIOGEL STRL SZ 7 (GLOVE) ×2 IMPLANT
GLOVE SUPERSENSE BIOGEL SZ 7 (GLOVE) ×2
GLOVE SURG SS PI 7.5 STRL IVOR (GLOVE) ×8 IMPLANT
GOWN STRL REUS W/ TWL LRG LVL3 (GOWN DISPOSABLE) ×6 IMPLANT
GOWN STRL REUS W/ TWL XL LVL3 (GOWN DISPOSABLE) ×4 IMPLANT
GOWN STRL REUS W/TWL 2XL LVL3 (GOWN DISPOSABLE) ×4 IMPLANT
GOWN STRL REUS W/TWL LRG LVL3 (GOWN DISPOSABLE) ×6
GOWN STRL REUS W/TWL XL LVL3 (GOWN DISPOSABLE) ×4
HALTER HD/CHIN CERV TRACTION D (MISCELLANEOUS) ×4 IMPLANT
KIT BASIN OR (CUSTOM PROCEDURE TRAY) ×4 IMPLANT
KIT ROOM TURNOVER OR (KITS) ×4 IMPLANT
MARKER SKIN DUAL TIP RULER LAB (MISCELLANEOUS) ×4 IMPLANT
NEEDLE HYPO 18GX1.5 BLUNT FILL (NEEDLE) IMPLANT
NEEDLE HYPO 25X1 1.5 SAFETY (NEEDLE) ×8 IMPLANT
NEEDLE SPNL 22GX3.5 QUINCKE BK (NEEDLE) ×8 IMPLANT
NS IRRIG 1000ML POUR BTL (IV SOLUTION) ×4 IMPLANT
OIL CARTRIDGE MAESTRO DRILL (MISCELLANEOUS) ×4
PACK LAMINECTOMY NEURO (CUSTOM PROCEDURE TRAY) ×4 IMPLANT
PACK SURGICAL SETUP 50X90 (CUSTOM PROCEDURE TRAY) ×4 IMPLANT
PAD ARMBOARD 7.5X6 YLW CONV (MISCELLANEOUS) ×12 IMPLANT
PIN DISTRACTION 14MM (PIN) ×8 IMPLANT
PLATE AVIATOR ASSY 1LVL SZ 14 (Plate) ×4 IMPLANT
RUBBERBAND STERILE (MISCELLANEOUS) ×8 IMPLANT
SCREW AVIAT VAR SLFTAP 4.35X14 (Screw) ×4 IMPLANT
SCREW AVIATOR VAR SELFTAP 4X14 (Screw) ×12 IMPLANT
SPACER CERV AVS 8X14X16 4D (Spacer) ×4 IMPLANT
SPONGE INTESTINAL PEANUT (DISPOSABLE) ×8 IMPLANT
SPONGE LAP 4X18 X RAY DECT (DISPOSABLE) ×4 IMPLANT
SPONGE SURGIFOAM ABS GEL SZ50 (HEMOSTASIS) ×4 IMPLANT
STAPLER SKIN PROX WIDE 3.9 (STAPLE) IMPLANT
STOCKINETTE 4X48 STRL (DRAPES) ×4 IMPLANT
SUT ETHILON 3 0 PS 1 (SUTURE) ×4 IMPLANT
SUT VIC AB 3-0 SH 8-18 (SUTURE) ×4 IMPLANT
SYR 3ML LL SCALE MARK (SYRINGE) IMPLANT
SYR BULB 3OZ (MISCELLANEOUS) ×4 IMPLANT
SYR CONTROL 10ML LL (SYRINGE) ×4 IMPLANT
TOWEL GREEN STERILE (TOWEL DISPOSABLE) ×3 IMPLANT
TOWEL GREEN STERILE FF (TOWEL DISPOSABLE) ×8 IMPLANT
TUBE CONNECTING 12'X1/4 (SUCTIONS) ×1
TUBE CONNECTING 12X1/4 (SUCTIONS) ×3 IMPLANT
UNDERPAD 30X30 (UNDERPADS AND DIAPERS) ×4 IMPLANT
WATER STERILE IRR 1000ML POUR (IV SOLUTION) ×4 IMPLANT

## 2016-06-21 NOTE — Progress Notes (Signed)
Awake, alert, conversant.  MAEW with full bilateral biceps, triceps, hand intrinsic strength.  Doing well.

## 2016-06-21 NOTE — Anesthesia Procedure Notes (Signed)
Procedure Name: Intubation Date/Time: 06/21/2016 11:40 AM Performed by: Lovie CholOCK, Malvin Morrish K Pre-anesthesia Checklist: Patient identified, Emergency Drugs available, Suction available, Patient being monitored and Timeout performed Patient Re-evaluated:Patient Re-evaluated prior to inductionOxygen Delivery Method: Circle system utilized Preoxygenation: Pre-oxygenation with 100% oxygen Intubation Type: IV induction Ventilation: Oral airway inserted - appropriate to patient size Grade View: Grade I Tube type: Oral Tube size: 7.5 mm Number of attempts: 1 Airway Equipment and Method: Video-laryngoscopy and Rigid stylet Placement Confirmation: ETT inserted through vocal cords under direct vision,  positive ETCO2 and breath sounds checked- equal and bilateral Secured at: 21 cm Tube secured with: Tape Dental Injury: Teeth and Oropharynx as per pre-operative assessment  Comments: Elective video-glide.

## 2016-06-21 NOTE — Transfer of Care (Signed)
Immediate Anesthesia Transfer of Care Note  Patient: Douglas Aguirre  Procedure(s) Performed: Procedure(s) with comments: Cervical Six-Seven Anterior cervical decompression/discectomy/fusion; LEFT CARPAL TUNNEL RELEASE (N/A) - left side approach LEFT CARPAL TUNNEL RELEASE (Left) - LEFT   Patient Location: PACU  Anesthesia Type:General  Level of Consciousness: awake, oriented and patient cooperative  Airway & Oxygen Therapy: Patient Spontanous Breathing and Patient connected to nasal cannula oxygen  Post-op Assessment: Report given to RN and Post -op Vital signs reviewed and stable  Post vital signs: Reviewed  Last Vitals:  Vitals:   06/21/16 0913 06/21/16 1415  BP: 128/61 (P) 139/77  Pulse: 73   Resp: 20 (!) (P) 6  Temp: 36.7 C (P) 36.8 C    Last Pain:  Vitals:   06/21/16 1415  TempSrc:   PainSc: (P) 0-No pain      Patients Stated Pain Goal: 2 (06/21/16 0933)  Complications: No apparent anesthesia complications

## 2016-06-21 NOTE — Anesthesia Postprocedure Evaluation (Signed)
Anesthesia Post Note  Patient: Douglas Aguirre  Procedure(s) Performed: Procedure(s) (LRB): Cervical Six-Seven Anterior cervical decompression/discectomy/fusion; LEFT CARPAL TUNNEL RELEASE (N/A) LEFT CARPAL TUNNEL RELEASE (Left)  Patient location during evaluation: PACU Anesthesia Type: General Level of consciousness: awake and alert Pain management: pain level controlled Vital Signs Assessment: post-procedure vital signs reviewed and stable Respiratory status: spontaneous breathing, nonlabored ventilation, respiratory function stable and patient connected to nasal cannula oxygen Cardiovascular status: blood pressure returned to baseline and stable Postop Assessment: no signs of nausea or vomiting Anesthetic complications: no       Last Vitals:  Vitals:   06/21/16 1500 06/21/16 1515  BP: 134/72 132/68  Pulse: 80 83  Resp: 14 13  Temp:      Last Pain:  Vitals:   06/21/16 1457  TempSrc:   PainSc: 5                  Rosbel Buckner,W. EDMOND

## 2016-06-21 NOTE — Progress Notes (Signed)
Pt placed on cpap and was having trouble with mask size due to neck brace.  Rt had to place two different sizes on pt to get a good seal.  Pt charged for both masks and the extra one was left for pt to take home to use as a back up if needed.  Rt will monitor.

## 2016-06-21 NOTE — Interval H&P Note (Signed)
History and Physical Interval Note:  06/21/2016 10:37 AM  Douglas Aguirre  has presented today for surgery, with the diagnosis of Cervical herniated disc; Carpal tunnel syndrome  The various methods of treatment have been discussed with the patient and family. After consideration of risks, benefits and other options for treatment, the patient has consented to  Procedure(s) with comments: C6-7 Anterior cervical decompression/discectomy/fusion; LEFT CARPAL TUNNEL RELEASE (N/A) - C6-7 Anterior cervical decompression/discectomy/fusion LEFT CARPAL TUNNEL RELEASE (Left) - LEFT CARPAL TUNNEL RELEASE as a surgical intervention .  The patient's history has been reviewed, patient examined, no change in status, stable for surgery.  I have reviewed the patient's chart and labs.  Questions were answered to the patient's satisfaction.     Geraldean Walen D

## 2016-06-21 NOTE — Brief Op Note (Signed)
06/21/2016  2:06 PM  PATIENT:  Douglas Aguirre  66 y.o. male  PRE-OPERATIVE DIAGNOSIS:  Cervical herniated disc, cervical stenosis, cervical radiculopathy, cervicalgia C 6 - 7;  Carpal tunnel syndrome left  POST-OPERATIVE DIAGNOSIS:  Cervical herniated disc, cervical stenosis, cervical radiculopathy, cervicalgia C 6 - 7;  Carpal tunnel syndrome left  PROCEDURE:  Procedure(s) with comments: Cervical Six-Seven Anterior cervical decompression/discectomy/fusion with PEEK cage, autograft, plate; LEFT CARPAL TUNNEL RELEASE (N/A) - left side approach LEFT CARPAL TUNNEL RELEASE (Left) - LEFT   SURGEON:  Surgeon(s) and Role:    Maeola Harman* Arie Powell, MD - Primary    * Lisbeth RenshawNundkumar, Neelesh, MD - Assisting  PHYSICIAN ASSISTANT:   ASSISTANTS: Poteat, RN   ANESTHESIA:   general  EBL:  Total I/O In: 1000 [I.V.:1000] Out: 25 [Blood:25]  BLOOD ADMINISTERED:none  DRAINS: none   LOCAL MEDICATIONS USED:  MARCAINE    and LIDOCAINE   SPECIMEN:  No Specimen  DISPOSITION OF SPECIMEN:  N/A  COUNTS:  YES  TOURNIQUET:  * No tourniquets in log *  DICTATION: .Patient was brought to operating room and following the smooth and uncomplicated induction of general endotracheal anesthesia his head was placed on a horseshoe head holder he was placed in 5 pounds of Holter traction and his anterior neck was prepped and draped in usual sterile fashion. An incision was made on the left side of midline after infiltrating the skin and subcutaneous tissues with local lidocaine. The platysmal layer was incised and subplatysmal dissection was performed exposing the anterior border sternocleidomastoid muscle. Using blunt dissection the carotid sheath was kept lateral and trachea and esophagus kept medial exposing the anterior cervical spine. A bent spinal needle was placed it was felt to be the C 34 level and this was confirmed on intraoperative x-ray. Longus coli muscles were taken down from the anterior cervical spine using  electrocautery and key elevator and self-retaining retractor was placed at the C 67 level. The interspace was incised and a thorough discectomy was performed. Distraction pins were placed. Uncinate spurs and central spondylitic ridges were drilled down with a high-speed drill. The spinal cord dura and both C7 nerve roots were widely decompressed. Hemostasis was assured. After trial sizing an 8 mm peek interbody cage was selected and packed with local autograft. Tamped into position and countersunk appropriately. Distraction weight was removed. A 14 mm Aviator anterior cervical plate was affixed to the cervical spine with 14 mm variable-angle screws 2 at C6 and 2 at C7. All screws were well-positioned and locking mechanisms were engaged. Soft tissues were inspected and found to be in good repair. The wound was irrigated. The platysma layer was closed with 3-0 Vicryl stitches and the skin was reapproximated with 3-0 Vicryl subcuticular stitches. The wound was dressed with Dermabond. Counts were correct at the end of the case. Patient was extubated and taken to recovery in stable and satisfactory condition.  Patient was repositioned with his left arm on the arm board.  His hand and arm were prepped and draped with betadine scrub and Duraprep and sterile stockinet.  Left wrist was infiltrated with lidocaine and an incision was made over a length of 2 cm in line with the fourth ray.  The flexor retinaculum was incised and the carpal tunnel was decompressed.  Decompression was carried into the volar wrist and into the distal palm.  Hemostasis was assured. The wound was irrigated and closed with 3-0 nylon vertical mattress stitches and dressed with a sterile occlusive dressing with  Adaptic, fluff gauze, Kerlix and cling wrap. Patient was taken to recovery in stable and satisfactory condition. Counts were correct at the end of the case.  PLAN OF CARE: Admit to inpatient   PATIENT DISPOSITION:  PACU - hemodynamically  stable.   Delay start of Pharmacological VTE agent (>24hrs) due to surgical blood loss or risk of bleeding: yes

## 2016-06-21 NOTE — Op Note (Signed)
06/21/2016  2:06 PM  PATIENT:  Douglas Aguirre  66 y.o. male  PRE-OPERATIVE DIAGNOSIS:  Cervical herniated disc, cervical stenosis, cervical radiculopathy, cervicalgia C 6 - 7;  Carpal tunnel syndrome left  POST-OPERATIVE DIAGNOSIS:  Cervical herniated disc, cervical stenosis, cervical radiculopathy, cervicalgia C 6 - 7;  Carpal tunnel syndrome left  PROCEDURE:  Procedure(s) with comments: Cervical Six-Seven Anterior cervical decompression/discectomy/fusion with PEEK cage, autograft, plate; LEFT CARPAL TUNNEL RELEASE (N/A) - left side approach LEFT CARPAL TUNNEL RELEASE (Left) - LEFT   SURGEON:  Surgeon(s) and Role:    Maeola Harman* Meisha Salone, MD - Primary    * Lisbeth RenshawNundkumar, Neelesh, MD - Assisting  PHYSICIAN ASSISTANT:   ASSISTANTS: Poteat, RN   ANESTHESIA:   general  EBL:  Total I/O In: 1000 [I.V.:1000] Out: 25 [Blood:25]  BLOOD ADMINISTERED:none  DRAINS: none   LOCAL MEDICATIONS USED:  MARCAINE    and LIDOCAINE   SPECIMEN:  No Specimen  DISPOSITION OF SPECIMEN:  N/A  COUNTS:  YES  TOURNIQUET:  * No tourniquets in log *  DICTATION: .Patient was brought to operating room and following the smooth and uncomplicated induction of general endotracheal anesthesia his head was placed on a horseshoe head holder he was placed in 5 pounds of Holter traction and his anterior neck was prepped and draped in usual sterile fashion. An incision was made on the left side of midline after infiltrating the skin and subcutaneous tissues with local lidocaine. The platysmal layer was incised and subplatysmal dissection was performed exposing the anterior border sternocleidomastoid muscle. Using blunt dissection the carotid sheath was kept lateral and trachea and esophagus kept medial exposing the anterior cervical spine. A bent spinal needle was placed it was felt to be the C 34 level and this was confirmed on intraoperative x-ray. Longus coli muscles were taken down from the anterior cervical spine using  electrocautery and key elevator and self-retaining retractor was placed at the C 67 level. The interspace was incised and a thorough discectomy was performed. Distraction pins were placed. Uncinate spurs and central spondylitic ridges were drilled down with a high-speed drill. The spinal cord dura and both C7 nerve roots were widely decompressed. Hemostasis was assured. After trial sizing an 8 mm peek interbody cage was selected and packed with local autograft. Tamped into position and countersunk appropriately. Distraction weight was removed. A 14 mm Aviator anterior cervical plate was affixed to the cervical spine with 14 mm variable-angle screws 2 at C6 and 2 at C7. All screws were well-positioned and locking mechanisms were engaged. Soft tissues were inspected and found to be in good repair. The wound was irrigated. The platysma layer was closed with 3-0 Vicryl stitches and the skin was reapproximated with 3-0 Vicryl subcuticular stitches. The wound was dressed with Dermabond. Counts were correct at the end of the case. Patient was extubated and taken to recovery in stable and satisfactory condition.  Patient was repositioned with his left arm on the arm board.  His hand and arm were prepped and draped with betadine scrub and Duraprep and sterile stockinet.  Left wrist was infiltrated with lidocaine and an incision was made over a length of 2 cm in line with the fourth ray.  The flexor retinaculum was incised and the carpal tunnel was decompressed.  Decompression was carried into the volar wrist and into the distal palm.  Hemostasis was assured. The wound was irrigated and closed with 3-0 nylon vertical mattress stitches and dressed with a sterile occlusive dressing with  Adaptic, fluff gauze, Kerlix and cling wrap. Patient was taken to recovery in stable and satisfactory condition. Counts were correct at the end of the case.  PLAN OF CARE: Admit to inpatient   PATIENT DISPOSITION:  PACU - hemodynamically  stable.   Delay start of Pharmacological VTE agent (>24hrs) due to surgical blood loss or risk of bleeding: yes

## 2016-06-22 ENCOUNTER — Encounter (HOSPITAL_COMMUNITY): Payer: Self-pay | Admitting: Neurosurgery

## 2016-06-22 DIAGNOSIS — M50123 Cervical disc disorder at C6-C7 level with radiculopathy: Secondary | ICD-10-CM | POA: Diagnosis not present

## 2016-06-22 LAB — GLUCOSE, CAPILLARY: GLUCOSE-CAPILLARY: 238 mg/dL — AB (ref 65–99)

## 2016-06-22 MED ORDER — HYDROCODONE-ACETAMINOPHEN 10-325 MG PO TABS
1.0000 | ORAL_TABLET | ORAL | Status: DC | PRN
  Administered 2016-06-22: 1 via ORAL
  Administered 2016-06-22: 2 via ORAL
  Administered 2016-06-22: 1 via ORAL
  Filled 2016-06-22: qty 2
  Filled 2016-06-22: qty 1

## 2016-06-22 MED ORDER — HYDROCODONE-ACETAMINOPHEN 10-325 MG PO TABS: 1.0000 | ORAL_TABLET | ORAL | 0 refills | Status: DC | PRN

## 2016-06-22 NOTE — Discharge Summary (Signed)
Physician Discharge Summary  Patient ID: LYMON KIDNEY MRN: [id number] DOB/AGE: [birth year] 64 y.o.  Admit date: 06/21/2016 Discharge date: 06/22/2016  Admission Diagnoses:Cervical herniated disc, cervical stenosis, cervical radiculopathy, cervicalgia C 6 - 7;  Carpal tunnel syndrome left     Discharge Diagnoses:Cervical herniated disc, cervical stenosis, cervical radiculopathy, cervicalgia C 6 - 7;  Carpal tunnel syndrome left s/p Cervical Six-Seven Anterior cervical decompression/discectomy/fusion with PEEK cage, autograft, plate; LEFT CARPAL TUNNEL RELEASE (N/A) - left side approach LEFT CARPAL TUNNEL RELEASE (Left) - LEFT      Active Problems:   Herniated cervical disc   Discharged Condition: good  Hospital Course: Douglas Aguirre was admitted for surgery with dx HNP and cervical stenosis and left carpal tunnel syndrome. Following uncomplicated ACDF C6-7 and left carpal tunnel release, he recovered nicely and transferred to [facility]. He has mobilized nicely.   Consults: None  Significant Diagnostic Studies: radiology: X-Ray: intra-op  Treatments: surgery: Cervical Six-Seven Anterior cervical decompression/discectomy/fusion with PEEK cage, autograft, plate; LEFT CARPAL TUNNEL RELEASE (N/A) - left side approach LEFT CARPAL TUNNEL RELEASE (Left) - LEFT      Discharge Exam: Blood pressure 114/64, pulse 84, temperature 97.7 F (36.5 C), temperature source Oral, resp. rate 18, height 5\' 10"  (1.778 m), weight 114 kg (251 lb 4.8 oz), SpO2 96 %. Alert, conversant, reporting no pain at present. Good strength BUE. Incision without erytema or drainage beneath Dermabond & honeycomb drsg cervical site. Incision "puffy" but soft, nontender to palpation. Left wrist drsg intact. Fingers without swelling. Pt reports no difficulties or pain with left hand/wrist.      Disposition: Discharge to home. Rx for Norco to chart for prn home use. Pt verbalizes understanding of d/c instructions and agrees to call  office for 2 week suture removal appt.      Discharge Instructions    Diet - low sodium heart healthy    Complete by:  As directed    Increase activity slowly    Complete by:  As directed      Allergies as of 06/22/2016      Reactions   Niacin Other (See Comments)   Other Reaction: red and burning      Medication List    STOP taking these medications   clopidogrel 75 MG tablet Commonly known as:  PLAVIX     TAKE these medications   ALPRAZolam 0.5 MG tablet Commonly known as:  XANAX Take 0.5 mg by mouth 2 (two) times daily as needed for anxiety.   aspirin EC 81 MG tablet Take 81 mg by mouth daily.   carvedilol 6.25 MG tablet Commonly known as:  COREG Take 6.25 mg by mouth 2 (two) times daily.   fenofibrate micronized 134 MG capsule Commonly known as:  LOFIBRA Take 134 mg by mouth at bedtime.   glimepiride 4 MG tablet Commonly known as:  AMARYL Take 4 mg by mouth 2 (two) times daily.   GOODYS EXTRA STRENGTH 500-325-65 MG Pack Generic drug:  Aspirin-Acetaminophen-Caffeine Take 1 packet by mouth daily as needed (for headaches.).   HYDROcodone-acetaminophen 10-325 MG tablet Commonly known as:  NORCO Take 1 tablet by mouth 5 (five) times daily. What changed:  Another medication with the same name was added. Make sure you understand how and when to take each.   HYDROcodone-acetaminophen 10-325 MG tablet Commonly known as:  NORCO Take 1-2 tablets by mouth every 4 (four) hours as needed for severe pain. What changed:  You were already taking a medication with the same  name, and this prescription was added. Make sure you understand how and when to take each.   LEVEMIR FLEXPEN 100 UNIT/ML Pen Generic drug:  Insulin Detemir Inject 100 Units into the skin daily. Takes approx. 9am q morning   levothyroxine 50 MCG tablet Commonly known as:  SYNTHROID, LEVOTHROID Take 50 mcg by mouth daily before breakfast.   multivitamin with minerals Tabs tablet Take 1 tablet by  mouth daily.   nitroGLYCERIN 0.4 MG SL tablet Commonly known as:  NITROSTAT Place 0.4 mg under the tongue every 5 (five) minutes as needed for chest pain.   omeprazole 40 MG capsule Commonly known as:  PRILOSEC Take 40 mg by mouth 2 (two) times daily.   PRESCRIPTION MEDICATION Take 10 mg by mouth at bedtime. DOMPERIDONE 10 MG   ramipril 10 MG capsule Commonly known as:  ALTACE Take 10 mg by mouth daily.   sertraline 50 MG tablet Commonly known as:  ZOLOFT Take 50 mg by mouth daily.   simvastatin 40 MG tablet Commonly known as:  ZOCOR Take 40 mg by mouth at bedtime.   sitaGLIPtin-metformin 50-1000 MG tablet Commonly known as:  JANUMET Take 1 tablet by mouth 2 (two) times daily with a meal.   traZODone 100 MG tablet Commonly known as:  DESYREL Take 100 mg by mouth at bedtime.   VICTOZA 18 MG/3ML Sopn Generic drug:  liraglutide Inject 0.6 mg into the skin at bedtime.        Signed: Georgiann Cockeroteat, Demara Lover 06/22/2016, 7:53 AM

## 2016-06-22 NOTE — Progress Notes (Signed)
Subjective: Patient reports "I'm doing ok"  Objective: Vital signs in last 24 hours: Temp:  [97.7 F (36.5 C)-98.9 F (37.2 C)] 97.7 F (36.5 C) (05/09 0400) Pulse Rate:  [73-94] 84 (05/09 0400) Resp:  [6-20] 18 (05/09 0400) BP: (114-142)/(61-79) 114/64 (05/09 0400) SpO2:  [91 %-100 %] 96 % (05/09 0400) Weight:  [114 kg (251 lb 4.8 oz)] 114 kg (251 lb 4.8 oz) (05/08 0913)  Intake/Output from previous day: 05/08 0701 - 05/09 0700 In: 1500 [I.V.:1400; IV Piggyback:100] Out: 25 [Blood:25] Intake/Output this shift: No intake/output data recorded.  Alert, conversant, reporting no pain at present. Good strength BUE. Incision without erytema or drainage beneath Dermabond & honeycomb drsg cervical site. Incision "puffy" but soft, nontender to palpation. Left wrist drsg intact. Fingers without swelling. Pt reports no difficulties or pain with left hand/wrist.   Lab Results: No results for input(s): WBC, HGB, HCT, PLT in the last 72 hours. BMET No results for input(s): NA, K, CL, CO2, GLUCOSE, BUN, CREATININE, CALCIUM in the last 72 hours.  Studies/Results: Dg Cervical Spine 1 View  Result Date: 06/21/2016 CLINICAL DATA:  Intraoperative localization EXAM: CERVICAL SPINE 1 VIEW COMPARISON:  None. FINDINGS: Single cross-table lateral x-ray of the cervical spine is provided. Anterior metallic needle with the tip projecting at the level of C3-4. IMPRESSION: Intraoperative localization as described above. Electronically Signed   By: Elige KoHetal  Patel   On: 06/21/2016 15:10    Assessment/Plan: Improved  LOS: 1 day  Per DrStern, d/c to home. Rx for Norco to chart for prn home use. Pt verbalizes understanding of d/c instructions and agrees to call office for 2 week suture removal appt.    Georgiann Cockeroteat, Shari Natt 06/22/2016, 7:49 AM

## 2016-06-22 NOTE — Progress Notes (Signed)
Occupational Therapy Evaluation/Discharge Patient Details Name: Douglas Aguirre MRN: [id number] DOB: 1950-12-27 Today's Date: 06/22/2016    History of Present Illness 66 y.o. M S/P C5-7 ACDF and L Carpal Tunnel release.  PMH significant for HTN, GERD, MI, sleep apnea, depression, CAD, arthritis, anxiety.   Clinical Impression   PTA, pt independent with mobility. Wife assisted with ADL as needed due to L shoulder deficits. Completed education regarding compensatory techniques for ADL and mobility adhering to cervical precautions. Also educated on tendon gliding exercises for B hands. Pt appears to demonstrate symptoms consistent with impingement of R shoulder.  Feel pt would benefit from OT at the neuro outpt clinic when medically appropriate to educate pt on postural changes, strengthening to increase functional use of BUE and educate pt on availability of AE to maximize his functional level of independence.     Follow Up Recommendations  Supervision - Intermittent (outpt therapy when medically appropriate)    Equipment Recommendations  None recommended by OT    Recommendations for Other Services  Neuro outpt OT once able to participate/cleared by MD  Precautions / Restrictions Precautions Precautions: Cervical Precaution Comments: Handout provided and reviewed with pt. Required Braces or Orthoses: Cervical Brace Cervical Brace: Soft collar (may be off in bed, when up at night to bathroom and for ADL) Restrictions Weight Bearing Restrictions: No Other Position/Activity Restrictions: L hand/wrist with dressing, minimize use and weightbearing of L hand.      Mobility Bed Mobility Overal bed mobility: Modified Independent             General bed mobility comments: Cues for log roll required and no physical assist required  Transfers Overall transfer level: Modified independent Equipment used: None                  Balance Overall balance assessment: No apparent balance  deficits (not formally assessed)                                         ADL either performed or assessed with clinical judgement   ADL Overall ADL's : Needs assistance/impaired                                     Functional mobility during ADLs: Modified independent General ADL Comments: Completed education regarding compensatory techniques for ADL adhering to cervical precautions. Handout reviewed. Pt states his wife assists him with UB dressing due to his L shoulder deficits. Pt issued red tubing to build up handles on utensils, toothbrush and other objects as needed. Recommended pt use reacher to retrieve items from floor. Educated on home safety/proper set up and reducing risk of falls. Pt educated on donning/doffing cervical collar and wearing times. Pt verbalized understanding.      Vision         Perception     Praxis      Pertinent Vitals/Pain Pain Assessment: 0-10 Pain Score: 5  Faces Pain Scale: Hurts a little bit Pain Location: Neck and L hand Pain Descriptors / Indicators: Operative site guarding;Discomfort Pain Intervention(s): Limited activity within patient's tolerance     Hand Dominance Right   Extremity/Trunk Assessment Upper Extremity Assessment Upper Extremity Assessment: RUE deficits/detail;LUE deficits/detail RUE Deficits / Details: complains of pain consistent with impingement with R shoulder but functional; has +tinnels R with  paln for carpal tunnel release. Describes difficulty maintaining grip on items/dropping items; note minimal intrinsic wasting; weak extensor groups and weak lower traps. RUE Sensation: decreased light touch RUE Coordination: decreased fine motor LUE Deficits / Details: S/P carpal tunnel release; difficulty with FF - pt substitutes into shoulder elevation and R sidebending; painful; functional ROM LUE; painwith thumb/little finger opposition LUE Sensation: decreased light touch   Lower Extremity  Assessment Lower Extremity Assessment: Overall WFL for tasks assessed   Cervical / Trunk Assessment Cervical / Trunk Assessment: Other exceptions (minimal thoracic kyphosis with poor posture/shoulders forwar)   Communication Communication Communication: No difficulties   Cognition Arousal/Alertness: Awake/alert (pt falling sleep initially) Behavior During Therapy: WFL for tasks assessed/performed Overall Cognitive Status: Within Functional Limits for tasks assessed                                     General Comments       Exercises Exercises: Other exercises Other Exercises Other Exercises: tendon gliding exercises B hands Other Exercises: education on improving posture and need to strengthen lower trap muscles Other Exercises: Pt staes he has theraputty to use at home. Educated pt that he could complete these exercises with his r hand, but toonly complete tendon gliding ex at this timewith his L hand and to ask Dr. Venetia MaxonStern when he can begin using putty with his L hand.    Shoulder Instructions      Home Living Family/patient expects to be discharged to:: Private residence Living Arrangements: Spouse/significant other Available Help at Discharge: Family;Available 24 hours/day Type of Home: House Home Access: Stairs to enter Entergy CorporationEntrance Stairs-Number of Steps: 2 Entrance Stairs-Rails: Right Home Layout: One level     Bathroom Shower/Tub: Producer, television/film/videoWalk-in shower   Bathroom Toilet: Handicapped height Bathroom Accessibility: Yes How Accessible: Accessible via walker Home Equipment: Bedside commode;Shower seat;Walker - 2 wheels          Prior Functioning/Environment Level of Independence: Independent;Needs assistance  Gait / Transfers Assistance Needed: independent ADL's / Homemaking Assistance Needed: Wife assists with UB dressing due to L shoulder RTC deficits   Comments: retired; history tof B shoulder deficits. RTC surgery L shoulder in November. upcoming carpal  tunnel surgery R hand upcoming in July        OT Problem List: Decreased strength;Decreased range of motion;Decreased coordination;Decreased knowledge of precautions;Impaired sensation;Obesity;Pain;Impaired UE functional use      OT Treatment/Interventions:      OT Goals(Current goals can be found in the care plan section) Acute Rehab OT Goals Patient Stated Goal: to have more use out of his hands/arms OT Goal Formulation: All assessment and education complete, DC therapy  OT Frequency:     Barriers to D/C:            Co-evaluation              AM-PAC PT "6 Clicks" Daily Activity     Outcome Measure Help from another person eating meals?: None Help from another person taking care of personal grooming?: A Little Help from another person toileting, which includes using toliet, bedpan, or urinal?: None Help from another person bathing (including washing, rinsing, drying)?: A Little Help from another person to put on and taking off regular upper body clothing?: A Little Help from another person to put on and taking off regular lower body clothing?: None 6 Click Score: 21   End of Session Equipment Utilized During  Treatment: Cervical collar Nurse Communication: Mobility status  Activity Tolerance: Patient tolerated treatment well Patient left: in bed;with call bell/phone within reach  OT Visit Diagnosis: Pain;Muscle weakness (generalized) (M62.81) Pain - Right/Left: Left Pain - part of body: Hand (neck)                Time: 1610-9604 OT Time Calculation (min): 30 min Charges:  OT General Charges $OT Visit: 1 Procedure OT Evaluation $OT Eval Moderate Complexity: 1 Procedure OT Treatments $Therapeutic Activity: 8-22 mins G-Codes: OT G-codes **NOT FOR INPATIENT CLASS** Functional Assessment Tool Used: Clinical judgement Functional Limitation: Self care Self Care Current Status (V4098): At least 1 percent but less than 20 percent impaired, limited or restricted Self  Care Goal Status (J1914): At least 1 percent but less than 20 percent impaired, limited or restricted Self Care Discharge Status [id number]): At least 1 percent but less than 20 percent impaired, limited or restricted   [facility], OT/L  [phone] 06/22/2016  Josep Luviano,HILLARY 06/22/2016, 11:14 AM

## 2016-06-22 NOTE — Care Management CC44 (Signed)
Condition Code 44 Documentation Completed  Patient Details  Name: Douglas Aguirre MRN: [id number] Date of Birth: 11/11/1950   Condition Code 44 given:  Yes Patient signature on Condition Code 44 notice:  Yes Documentation of 2 MD's agreement:  Yes Code 44 added to claim:  Yes    Durenda GuthrieBrady, Gaby Harney Naomi, RN 06/22/2016, 10:48 AM

## 2016-06-22 NOTE — Care Management Obs Status (Signed)
MEDICARE OBSERVATION STATUS NOTIFICATION   Patient Details  Name: Douglas Aguirre MRN: [id number] Date of Birth: 1950-04-09   Medicare Observation Status Notification Given:  Yes    Durenda GuthrieBrady, Macala Baldonado Naomi, RN 06/22/2016, 10:48 AM

## 2016-06-22 NOTE — Discharge Instructions (Signed)

## 2016-06-22 NOTE — Evaluation (Signed)
Physical Therapy Evaluation and Discharge Patient Details Name: Douglas Aguirre MRN: [id number] DOB: 03-03-1950 Today's Date: 06/22/2016   History of Present Illness  66 y.o. M S/P C5-7 ACDF and L Carpal Tunnel release.  PMH significant for HTN, GERD, MI, sleep apnea, depression, CAD, arthritis, anxiety.  Clinical Impression  Pt presents to PT S/P above procedures with associated pain and decreased mobility tolerance.  He was educated on cervical precautions and safe functional movements.  PTA, he was independent with all mobility, requiring little assist from wife with the left hand.  He was Modified Independent for all mobility today and is safe to return home where his wife is available to assist 24/7 if necessary.  PT is signing off, but please contact if further assistance is needed.    Follow Up Recommendations No PT follow up;Supervision - Intermittent    Equipment Recommendations  None recommended by PT    Recommendations for Other Services       Precautions / Restrictions Precautions Precautions: Cervical;Fall Precaution Comments: Handout provided and reviewed with pt. Required Braces or Orthoses: Cervical Brace Cervical Brace: Soft collar Restrictions Weight Bearing Restrictions: No Other Position/Activity Restrictions: L hand/wrist with dressing, minimize use and weightbearing of L hand.      Mobility  Bed Mobility Overal bed mobility: Modified Independent             General bed mobility comments: Cues for log roll required and no physical assist required  Transfers Overall transfer level: Modified independent Equipment used: None                Ambulation/Gait Ambulation/Gait assistance: Modified independent (Device/Increase time) Ambulation Distance (Feet): 250 Feet Assistive device: None Gait Pattern/deviations: Step-through pattern;Wide base of support Gait velocity: normal Gait velocity interpretation: at or above normal speed for age/gender     Stairs Stairs: Yes Stairs assistance: Modified independent (Device/Increase time) Stair Management: One rail Right;Alternating pattern Number of Stairs: 5 General stair comments: Pt steady on stairs with no sign of LOB.  Wheelchair Mobility    Modified Rankin (Stroke Patients Only)       Balance Overall balance assessment: No apparent balance deficits (not formally assessed)                                           Pertinent Vitals/Pain Pain Assessment: Faces Faces Pain Scale: Hurts a little bit Pain Location: Neck and L hand Pain Descriptors / Indicators: Operative site guarding;Discomfort Pain Intervention(s): Limited activity within patient's tolerance;Monitored during session;Repositioned    Home Living Family/patient expects to be discharged to:: Private residence Living Arrangements: Spouse/significant other Available Help at Discharge: Family;Available 24 hours/day Type of Home: House Home Access: Stairs to enter Entrance Stairs-Rails: Right Entrance Stairs-Number of Steps: 2 Home Layout: One level Home Equipment: Bedside commode;Shower seat      Prior Function Level of Independence: Independent               Hand Dominance        Extremity/Trunk Assessment   Upper Extremity Assessment Upper Extremity Assessment: LUE deficits/detail LUE Deficits / Details: S/P carpal tunnel release    Lower Extremity Assessment Lower Extremity Assessment: Overall WFL for tasks assessed    Cervical / Trunk Assessment Cervical / Trunk Assessment: Normal  Communication   Communication: No difficulties  Cognition Arousal/Alertness: Awake/alert (pt falling sleep initially) Behavior During Therapy: [facility] for  tasks assessed/performed Overall Cognitive Status: Within Functional Limits for tasks assessed                                        General Comments      Exercises     Assessment/Plan    PT Assessment Patent  does not need any further PT services  PT Problem List         PT Treatment Interventions      PT Goals (Current goals can be found in the Care Plan section)  Acute Rehab PT Goals Patient Stated Goal: pt did not state a goal PT Goal Formulation: With patient Time For Goal Achievement: 06/29/16 Potential to Achieve Goals: Good    Frequency     Barriers to discharge        Co-evaluation               AM-PAC PT "6 Clicks" Daily Activity  Outcome Measure Difficulty turning over in bed (including adjusting bedclothes, sheets and blankets)?: A Little Difficulty moving from lying on back to sitting on the side of the bed? : A Little Difficulty sitting down on and standing up from a chair with arms (e.g., wheelchair, bedside commode, etc,.)?: Total Help needed moving to and from a bed to chair (including a wheelchair)?: None Help needed walking in hospital room?: None Help needed climbing 3-5 steps with a railing? : None 6 Click Score: 19    End of Session Equipment Utilized During Treatment: Gait belt;Cervical collar Activity Tolerance: Patient tolerated treatment well Patient left: in chair;with call bell/phone within reach Nurse Communication: Mobility status PT Visit Diagnosis: Other abnormalities of gait and mobility (R26.89);Pain Pain - part of body: Hand (neck)    Time: [id number] PT Time Calculation (min) (ACUTE ONLY): 19 min   Charges:         PT G Codes:        Douglas Aguirre  Douglas Aguirre 06/22/2016, 9:11 AM

## 2016-06-22 NOTE — Progress Notes (Signed)
Inpatient Diabetes Program Recommendations  AACE/ADA: New Consensus Statement on Inpatient Glycemic Control (2015)  Target Ranges:  Prepandial:   less than 140 mg/dL      Peak postprandial:   less than 180 mg/dL (1-2 hours)      Critically ill patients:  140 - 180 mg/dL   Lab Results  Component Value Date   GLUCAP 238 (H) 06/22/2016   HGBA1C 9.4 (H) 06/15/2016    Review of Glycemic Control  Inpatient Diabetes Program Recommendations:   Spoke with pt @ bedside about A1C results 9.4 (average BG 223 over the past 2-3 months) and explained what an A1C is, basic pathophysiology of DM Type 2, basic home care, basic diabetes diet nutrition principles, importance of checking CBGs and maintaining good CBG control to prevent long-term and short-term complications. Reviewed signs and symptoms of hyperglycemia and hypoglycemia and how to treat hypoglycemia at home. Also reviewed blood sugar goals at home.  Patient states he knows he needs to cut back on carbohydrates and do portion control. Patient plans to discuss with his PCP plan to decrease carbohydrate intake.  Thank you, Billy FischerJudy E. Dayan Desa, RN, MSN, CDE  Diabetes Coordinator Inpatient Glycemic Control Team Team Pager [phone] (8am-5pm) 06/22/2016 10:21 AM

## 2016-08-10 ENCOUNTER — Encounter (HOSPITAL_COMMUNITY)
Admission: RE | Admit: 2016-08-10 | Discharge: 2016-08-10 | Disposition: A | Discharge status: Home / Self Care | Payer: Medicare Other | Source: Ambulatory Visit | Attending: Neurosurgery | Admitting: Neurosurgery

## 2016-08-10 ENCOUNTER — Encounter (HOSPITAL_COMMUNITY): Payer: Self-pay

## 2016-08-10 DIAGNOSIS — M1990 Unspecified osteoarthritis, unspecified site: Secondary | ICD-10-CM | POA: Insufficient documentation

## 2016-08-10 DIAGNOSIS — Z01818 Encounter for other preprocedural examination: Secondary | ICD-10-CM | POA: Diagnosis not present

## 2016-08-10 DIAGNOSIS — Z79899 Other long term (current) drug therapy: Secondary | ICD-10-CM | POA: Diagnosis not present

## 2016-08-10 DIAGNOSIS — Z7982 Long term (current) use of aspirin: Secondary | ICD-10-CM | POA: Insufficient documentation

## 2016-08-10 DIAGNOSIS — F419 Anxiety disorder, unspecified: Secondary | ICD-10-CM | POA: Diagnosis not present

## 2016-08-10 DIAGNOSIS — K219 Gastro-esophageal reflux disease without esophagitis: Secondary | ICD-10-CM | POA: Insufficient documentation

## 2016-08-10 DIAGNOSIS — Z87891 Personal history of nicotine dependence: Secondary | ICD-10-CM | POA: Diagnosis not present

## 2016-08-10 DIAGNOSIS — Z01812 Encounter for preprocedural laboratory examination: Secondary | ICD-10-CM | POA: Diagnosis present

## 2016-08-10 DIAGNOSIS — Z955 Presence of coronary angioplasty implant and graft: Secondary | ICD-10-CM | POA: Diagnosis not present

## 2016-08-10 DIAGNOSIS — F329 Major depressive disorder, single episode, unspecified: Secondary | ICD-10-CM | POA: Diagnosis not present

## 2016-08-10 DIAGNOSIS — I10 Essential (primary) hypertension: Secondary | ICD-10-CM | POA: Diagnosis not present

## 2016-08-10 DIAGNOSIS — I252 Old myocardial infarction: Secondary | ICD-10-CM | POA: Insufficient documentation

## 2016-08-10 DIAGNOSIS — Z7902 Long term (current) use of antithrombotics/antiplatelets: Secondary | ICD-10-CM | POA: Diagnosis not present

## 2016-08-10 DIAGNOSIS — I2510 Atherosclerotic heart disease of native coronary artery without angina pectoris: Secondary | ICD-10-CM | POA: Diagnosis not present

## 2016-08-10 DIAGNOSIS — G5601 Carpal tunnel syndrome, right upper limb: Secondary | ICD-10-CM | POA: Insufficient documentation

## 2016-08-10 DIAGNOSIS — G4733 Obstructive sleep apnea (adult) (pediatric): Secondary | ICD-10-CM | POA: Insufficient documentation

## 2016-08-10 DIAGNOSIS — Z794 Long term (current) use of insulin: Secondary | ICD-10-CM | POA: Diagnosis not present

## 2016-08-10 HISTORY — DX: Type 2 diabetes mellitus without complications: E11.9

## 2016-08-10 HISTORY — DX: Hypothyroidism, unspecified: E03.9

## 2016-08-10 LAB — CBC
HCT: 40.5 % (ref 39.0–52.0)
Hemoglobin: 13.5 g/dL (ref 13.0–17.0)
MCH: 29.2 pg (ref 26.0–34.0)
MCHC: 33.3 g/dL (ref 30.0–36.0)
MCV: 87.7 fL (ref 78.0–100.0)
Platelets: 209 10*3/uL (ref 150–400)
RBC: 4.62 MIL/uL (ref 4.22–5.81)
RDW: 13.4 % (ref 11.5–15.5)
WBC: 8.7 10*3/uL (ref 4.0–10.5)

## 2016-08-10 LAB — BASIC METABOLIC PANEL
ANION GAP: 9 (ref 5–15)
BUN: 15 mg/dL (ref 6–20)
CALCIUM: 9.5 mg/dL (ref 8.9–10.3)
CO2: 24 mmol/L (ref 22–32)
Chloride: 101 mmol/L (ref 101–111)
Creatinine, Ser: 0.88 mg/dL (ref 0.61–1.24)
GFR calc Af Amer: >60 mL/min (ref >60)
GLUCOSE: 323 mg/dL — AB (ref 65–99)
Potassium: 4.8 mmol/L (ref 3.5–5.1)
Sodium: 134 mmol/L — ABNORMAL LOW (ref 135–145)

## 2016-08-10 LAB — GLUCOSE, CAPILLARY: Glucose-Capillary: 337 mg/dL — ABNORMAL HIGH (ref 65–99)

## 2016-08-10 NOTE — Progress Notes (Addendum)
Anesthesia Chart Review: Patient is a 66 year old male scheduled for right carpal tunnel release on 08/19/16 by Dr. Vertell Limber. Procedure is posted for MAC anesthesia.  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   - PCP is Dr. Kerin Perna in [place], [place]. - Cardiologist is Dr. Orpah Greek at [facility]. Last visit 08/19/15. Prior to ACDF/left carpal tunnel release surgery last month, Dr. Vertell Limber did receive cardiac clearance from Dr. Sabra Heck. For those procedures, he recommended that patient stay on ASA, but could hold Plavix for 7 days prior to surgery. Next appointment for routine follow-up is scheduled for 08/16/16.  Meds include Xanax, aspirin 81 mg, Goody's extra strength, Coreg, Plavix, Lofibra, Amaryl, Norco, Levemir, levothyroxine, nitroglycerin, Prilosec, ramipril, Zoloft, Zocor, Janumet, trazodone, domperidone, Victoza. Patient reported being instructed to hold Plavix 5 days prior to surgery.   BP (!) 117/50   Pulse 72   Temp 36.8 C   Resp 20   Ht _0  (1.753 m)   Wt 253 lb 4.8 oz (114.9 kg)   SpO2 95%   BMI 37.41 kg/m  Non-fasting CBG at PAT 337 (reported eating lunch just before PAT).    EKG 06/21/16: NSR, inferior infarct (age undetermined).   Nuclear stress test 07/20/15 (Dr. Sabra Heck; scanned under Media tab; Correspondence, 06/21/16): Impression: Negative electrical portion of the test. On the Cardiolite portion, there were no significant wall motion abnormality seen. The summed rest score was 2, the summed stress score was 0. There were no reversible ischemic perfusion defects. EF 46%. Normal LV contraction. This would be a low risk scan.   Echo 07/15/15 (Dr.  Sabra Heck; scanned under Media tab; Correspondence, 06/21/16): Impressions: 1. LA is dilated. 2. RV/LV or dilated. 3. Mild LVH. 4. EF 55-60%.  Cardiac Cath 09/22/06 ([facility]; scanned under Media tab; Correspondence, 06/21/16): Normal LV contractility with EF 60-65%. LM was normal. Proximal CX was normal.  OM1 was normal. Small distal CX had 75%. This led into a small second circumflex marginal that had been seen on prior catheterization. Mid LAD now with 75% to 95%, increased for 25-50%. LAD had 25% stenosis further down and 25-50% in the mid to distal vessel. Proximal D1 had 25%. Ostial D2 25%. These were not large vessels. Additionally on the circumflex system, an obtuse marginal branch had 25% stenosis. Proximal RCA 25%. Mid RCA 25%. Distally the vessel had been stented in the posterolateral vessel. The stent appeared to be patent except in the midsegment of the stent there was a 75% restenosis.  PCI:  1. PCI/Xience DES was placed in the mid LAD. 2. PCI/Xience DES was placed in the previously placed stent in the distal RCA.   Preoperative labs noted. Cr 0.88. Non-fasting glucose 323. CBC WNL. A1c is in process (9.4 on 06/15/16). He reported home fasting CBGs ~ 170. (Update: A1c 8.9.)  Will leave chart for follow-up A1c and notes from future cardiology visit.  George Hugh [facility] Short Stay Center/Anesthesiology Phone [phone] 08/10/2016 3:40 PM  Addendum: I contacted Dr. Donnetta Simpers office for follow-up requested records. Patient was seen for routine cardiology follow-up on 08/16/16. Once note is signed and completed then staff will fax; however, no new testing ordered with one year follow-up  recommended (scheduled for 08/21/17). Patient was cleared for and tolerated surgery in May. I would anticipate that if no acute changes then he can proceed as planned.  George Hugh [facility] Short Stay Center/Anesthesiology Phone [phone] 08/18/2016 4:46 PM

## 2016-08-10 NOTE — Pre-Procedure Instructions (Addendum)
CORNELIS KLUVER  08/10/2016      [facility] - [place], [place] - [address] [place] [address] Phone: [phone] Fax: [phone]    Your procedure is scheduled on 08/19/16.  Report to [facility] Admitting at 530 A.M.  Call this number if you have problems the morning of surgery:  [phone]   Remember:  Do not eat food or drink liquids after midnight.  Take these medicines the morning of surgery with A SIP OF WATER    Sertraline(zoloft),carvedilol(coreg),levothyroxine, Omeprazole(prilosec),        nitro if needed,hydrocodone if needed,xanax if needed  STOP all herbel meds, nsaids (aleve,naproxen,advil,ibuprofen)7days prior to surgery starting 08/12/16 including all vitamins/supplements, goody's  No janumet,or glimepiride day of surgery   How to Manage Your Diabetes Before and After Surgery  Why is it important to control my blood sugar before and after surgery? . Improving blood sugar levels before and after surgery helps healing and can limit problems. . A way of improving blood sugar control is eating a healthy diet by: o  Eating less sugar and carbohydrates o  Increasing activity/exercise o  Talking with your doctor about reaching your blood sugar goals . High blood sugars (greater than 180 mg/dL) can raise your risk of infections and slow your recovery, so you will need to focus on controlling your diabetes during the weeks before surgery. . Make sure that the doctor who takes care of your diabetes knows about your planned surgery including the date and location.  How do I manage my blood sugar before surgery? . Check your blood sugar at least 4 times a day, starting 2 days before surgery, to make sure that the level is not too high or low. o Check your blood sugar the morning of your surgery when you wake up and every 2 hours until you get to the Short Stay unit. . If your blood sugar is less than 70 mg/dL, you will need  to treat for low blood sugar: o Do not take insulin. o Treat a low blood sugar (less than 70 mg/dL) with  cup of clear juice (cranberry or apple), 4 glucose tablets, OR glucose gel. o Recheck blood sugar in 15 minutes after treatment (to make sure it is greater than 70 mg/dL). If your blood sugar is not greater than 70 mg/dL on recheck, call [phone] for further instructions. . Report your blood sugar to the short stay nurse when you get to Short Stay.  . If you are admitted to the hospital after surgery: o Your blood sugar will be checked by the staff and you will probably be given insulin after surgery (instead of oral diabetes medicines) to make sure you have good blood sugar levels. o The goal for blood sugar control after surgery is 80-180 mg/dL.   WHAT DO I DO ABOUT MY DIABETES MEDICATION?   Marland Kitchen Do not take oral diabetes medicines (pills) the morning of surgery.(janumet,glimepiride) .   THE NIGHT BEFORE SURGERY, take usual dose of victoza  . THE  MORNING OF SURGERY, take 50 units of levemir   .    Do not wear jewelry, make-up or nail polish.  Do not wear lotions, powders, or perfumes, or deoderant.  Do not shave 48 hours prior to surgery.  Men may shave face and neck.  Do not bring valuables to the hospital.  [facility] is not responsible for any belongings or valuables.  Contacts, dentures or bridgework  may not be worn into surgery.  Leave your suitcase in the car.  After surgery it may be brought to your room.  For patients admitted to the hospital, discharge time will be determined by your treatment team.  Patients discharged the day of surgery will not be allowed to drive home.   Special instructions:  Special Instructions: [place] - Preparing for Surgery  Before surgery, you can play an important role.  Because skin is not sterile, your skin needs to be as free of germs as possible.  You can reduce the number of germs on you skin by washing with CHG  (chlorahexidine gluconate) soap before surgery.  CHG is an antiseptic cleaner which kills germs and bonds with the skin to continue killing germs even after washing.  Please DO NOT use if you have an allergy to CHG or antibacterial soaps.  If your skin becomes reddened/irritated stop using the CHG and inform your nurse when you arrive at Short Stay.  Do not shave (including legs and underarms) for at least 48 hours prior to the first CHG shower.  You may shave your face.  Please follow these instructions carefully:   1.  Shower with CHG Soap the night before surgery and the morning of Surgery.  2.  If you choose to wash your hair, wash your hair first as usual with your normal shampoo.  3.  After you shampoo, rinse your hair and body thoroughly to remove the Shampoo.  4.  Use CHG as you would any other liquid soap.  You can apply chg directly  to the skin and wash gently with scrungie or a clean washcloth.  5.  Apply the CHG Soap to your body ONLY FROM THE NECK DOWN.  Do not use on open wounds or open sores.  Avoid contact with your eyes ears, mouth and genitals (private parts).  Wash genitals (private parts)       with your normal soap.  6.  Wash thoroughly, paying special attention to the area where your surgery will be performed.  7.  Thoroughly rinse your body with warm water from the neck down.  8.  DO NOT shower/wash with your normal soap after using and rinsing off the CHG Soap.  9.  Pat yourself dry with a clean towel.            10.  Wear clean pajamas.            11.  Place clean sheets on your bed the night of your first shower and do not sleep with pets.  Day of Surgery  Do not apply any lotions/deodorants the morning of surgery.  Please wear clean clothes to the hospital/surgery center.  Please read over the  fact sheets that you were given.

## 2016-08-11 LAB — HEMOGLOBIN A1C
HEMOGLOBIN A1C: 8.9 % — AB (ref 4.8–5.6)
MEAN PLASMA GLUCOSE: 209 mg/dL

## 2016-08-18 NOTE — Anesthesia Preprocedure Evaluation (Signed)
Anesthesia Evaluation  Patient identified by MRN, date of birth, ID band Patient awake    Reviewed: Allergy & Precautions, H&P , Patient's Chart, lab work & pertinent test results, reviewed documented beta blocker date and time   Airway Mallampati: II  TM Distance: >3 FB Neck ROM: full    Dental no notable dental hx.    Pulmonary former smoker,    Pulmonary exam normal breath sounds clear to auscultation       Cardiovascular hypertension,  Rhythm:regular Rate:Normal     Neuro/Psych    GI/Hepatic   Endo/Other  diabetes  Renal/GU      Musculoskeletal   Abdominal   Peds  Hematology   Anesthesia Other Findings  Echo 07/15/15 (Dr. Sabra Heck; scanned under Media tab; Correspondence, 06/21/16): Impressions: 1. LA is dilated. 2. RV/LV or dilated. 3. Mild LVH. 4. EF 55-60%.  Reproductive/Obstetrics                             Anesthesia Physical Anesthesia Plan  ASA: II  Anesthesia Plan: MAC   Post-op Pain Management:    Induction: Intravenous  PONV Risk Score and Plan:   Airway Management Planned: Mask and Natural Airway  Additional Equipment:   Intra-op Plan:   Post-operative Plan:   Informed Consent: I have reviewed the patients History and Physical, chart, labs and discussed the procedure including the risks, benefits and alternatives for the proposed anesthesia with the patient or authorized representative who has indicated his/her understanding and acceptance.   Dental Advisory Given  Plan Discussed with: CRNA and Surgeon  Anesthesia Plan Comments:         Anesthesia Quick Evaluation

## 2016-08-19 ENCOUNTER — Ambulatory Visit (HOSPITAL_COMMUNITY): Payer: Medicare Other | Admitting: Certified Registered Nurse Anesthetist

## 2016-08-19 ENCOUNTER — Encounter (HOSPITAL_COMMUNITY): Payer: Self-pay | Admitting: *Deleted

## 2016-08-19 ENCOUNTER — Ambulatory Visit (HOSPITAL_COMMUNITY): Payer: Medicare Other | Admitting: Vascular Surgery

## 2016-08-19 ENCOUNTER — Ambulatory Visit (HOSPITAL_COMMUNITY)
Admission: RE | Admit: 2016-08-19 | Discharge: 2016-08-19 | Disposition: A | Discharge status: Home / Self Care | Payer: Medicare Other | Source: Ambulatory Visit | Attending: Neurosurgery | Admitting: Neurosurgery

## 2016-08-19 ENCOUNTER — Encounter (HOSPITAL_COMMUNITY)
Admission: RE | Disposition: A | Discharge status: Home / Self Care | Payer: Self-pay | Source: Ambulatory Visit | Attending: Neurosurgery

## 2016-08-19 DIAGNOSIS — G5601 Carpal tunnel syndrome, right upper limb: Secondary | ICD-10-CM | POA: Insufficient documentation

## 2016-08-19 DIAGNOSIS — I10 Essential (primary) hypertension: Secondary | ICD-10-CM | POA: Diagnosis not present

## 2016-08-19 DIAGNOSIS — Z79899 Other long term (current) drug therapy: Secondary | ICD-10-CM | POA: Diagnosis not present

## 2016-08-19 DIAGNOSIS — Z7902 Long term (current) use of antithrombotics/antiplatelets: Secondary | ICD-10-CM | POA: Diagnosis not present

## 2016-08-19 DIAGNOSIS — Z7982 Long term (current) use of aspirin: Secondary | ICD-10-CM | POA: Diagnosis not present

## 2016-08-19 DIAGNOSIS — E119 Type 2 diabetes mellitus without complications: Secondary | ICD-10-CM | POA: Insufficient documentation

## 2016-08-19 DIAGNOSIS — Z794 Long term (current) use of insulin: Secondary | ICD-10-CM | POA: Diagnosis not present

## 2016-08-19 DIAGNOSIS — Z87891 Personal history of nicotine dependence: Secondary | ICD-10-CM | POA: Insufficient documentation

## 2016-08-19 HISTORY — PX: CARPAL TUNNEL RELEASE: SHX101

## 2016-08-19 LAB — PROTIME-INR
INR: 1.03
Prothrombin Time: 13.5 seconds (ref 11.4–15.2)

## 2016-08-19 LAB — GLUCOSE, CAPILLARY
GLUCOSE-CAPILLARY: 206 mg/dL — AB (ref 65–99)
Glucose-Capillary: 198 mg/dL — ABNORMAL HIGH (ref 65–99)

## 2016-08-19 SURGERY — CARPAL TUNNEL RELEASE
Anesthesia: Monitor Anesthesia Care | Site: Hand | Laterality: Right

## 2016-08-19 MED ORDER — PROPOFOL 10 MG/ML IV BOLUS
INTRAVENOUS | Status: AC
  Filled 2016-08-19: qty 40

## 2016-08-19 MED ORDER — CHLORHEXIDINE GLUCONATE CLOTH 2 % EX PADS: 6.0000 | MEDICATED_PAD | Freq: Once | CUTANEOUS | Status: DC

## 2016-08-19 MED ORDER — PROPOFOL 500 MG/50ML IV EMUL
INTRAVENOUS | Status: DC | PRN
  Administered 2016-08-19: 100 ug/kg/min via INTRAVENOUS

## 2016-08-19 MED ORDER — PROPOFOL 10 MG/ML IV BOLUS
INTRAVENOUS | Status: DC | PRN
  Administered 2016-08-19 (×3): 20 mg via INTRAVENOUS

## 2016-08-19 MED ORDER — LIDOCAINE HCL (PF) 1 % IJ SOLN
INTRAMUSCULAR | Status: AC
  Filled 2016-08-19: qty 30

## 2016-08-19 MED ORDER — CEFAZOLIN SODIUM-DEXTROSE 2-4 GM/100ML-% IV SOLN
2.0000 g | INTRAVENOUS | Status: AC
  Administered 2016-08-19: 2 g via INTRAVENOUS
  Filled 2016-08-19: qty 100

## 2016-08-19 MED ORDER — LACTATED RINGERS IV SOLN
INTRAVENOUS | Status: DC | PRN
  Administered 2016-08-19: 07:00:00 via INTRAVENOUS

## 2016-08-19 MED ORDER — MIDAZOLAM HCL 2 MG/2ML IJ SOLN
INTRAMUSCULAR | Status: AC
  Filled 2016-08-19: qty 2

## 2016-08-19 MED ORDER — LIDOCAINE HCL (PF) 1 % IJ SOLN
INTRAMUSCULAR | Status: DC | PRN
  Administered 2016-08-19: 7 mL via SUBCUTANEOUS

## 2016-08-19 MED ORDER — MIDAZOLAM HCL 5 MG/5ML IJ SOLN
INTRAMUSCULAR | Status: DC | PRN
  Administered 2016-08-19 (×2): 1 mg via INTRAVENOUS

## 2016-08-19 MED ORDER — 0.9 % SODIUM CHLORIDE (POUR BTL) OPTIME
TOPICAL | Status: DC | PRN
  Administered 2016-08-19: 1000 mL

## 2016-08-19 MED ORDER — FENTANYL CITRATE (PF) 250 MCG/5ML IJ SOLN
INTRAMUSCULAR | Status: AC
  Filled 2016-08-19: qty 5

## 2016-08-19 MED ORDER — FENTANYL CITRATE (PF) 100 MCG/2ML IJ SOLN
INTRAMUSCULAR | Status: DC | PRN
  Administered 2016-08-19 (×2): 25 ug via INTRAVENOUS

## 2016-08-19 SURGICAL SUPPLY — 40 items
BANDAGE GAUZE 4  KLING STR (GAUZE/BANDAGES/DRESSINGS) ×3 IMPLANT
BLADE SURG 15 STRL LF DISP TIS (BLADE) ×1 IMPLANT
BLADE SURG 15 STRL SS (BLADE) ×2
BNDG GAUZE ELAST 4 BULKY (GAUZE/BANDAGES/DRESSINGS) ×3 IMPLANT
BNDG STRETCH 4X75 NS LF (GAUZE/BANDAGES/DRESSINGS) ×3 IMPLANT
CORD BIPOLAR FORCEPS 12FT (ELECTRODE) ×3 IMPLANT
DECANTER SPIKE VIAL GLASS SM (MISCELLANEOUS) IMPLANT
DRAPE EXTREMITY T 121X128X90 (DRAPE) ×3 IMPLANT
DRAPE HALF SHEET 40X57 (DRAPES) ×3 IMPLANT
DRSG EMULSION OIL 3X3 NADH (GAUZE/BANDAGES/DRESSINGS) ×3 IMPLANT
GAUZE SPONGE 4X4 12PLY STRL (GAUZE/BANDAGES/DRESSINGS) ×3 IMPLANT
GAUZE SPONGE 4X4 12PLY STRL LF (GAUZE/BANDAGES/DRESSINGS) ×3 IMPLANT
GAUZE SPONGE 4X4 16PLY XRAY LF (GAUZE/BANDAGES/DRESSINGS) ×3 IMPLANT
GLOVE BIO SURGEON STRL SZ8 (GLOVE) IMPLANT
GLOVE BIOGEL PI IND STRL 7.5 (GLOVE) ×1 IMPLANT
GLOVE BIOGEL PI IND STRL 8.5 (GLOVE) ×1 IMPLANT
GLOVE BIOGEL PI INDICATOR 7.5 (GLOVE) ×2
GLOVE BIOGEL PI INDICATOR 8.5 (GLOVE) ×2
GLOVE EXAM NITRILE LRG STRL (GLOVE) IMPLANT
GLOVE EXAM NITRILE XL STR (GLOVE) IMPLANT
GLOVE EXAM NITRILE XS STR PU (GLOVE) ×3 IMPLANT
GOWN STRL REUS W/ TWL LRG LVL3 (GOWN DISPOSABLE) IMPLANT
GOWN STRL REUS W/ TWL XL LVL3 (GOWN DISPOSABLE) ×1 IMPLANT
GOWN STRL REUS W/TWL 2XL LVL3 (GOWN DISPOSABLE) ×3 IMPLANT
GOWN STRL REUS W/TWL LRG LVL3 (GOWN DISPOSABLE)
GOWN STRL REUS W/TWL XL LVL3 (GOWN DISPOSABLE) ×2
KIT BASIN OR (CUSTOM PROCEDURE TRAY) ×3 IMPLANT
KIT ROOM TURNOVER OR (KITS) ×3 IMPLANT
NEEDLE HYPO 25X1 1.5 SAFETY (NEEDLE) ×3 IMPLANT
NS IRRIG 1000ML POUR BTL (IV SOLUTION) ×3 IMPLANT
PACK SURGICAL SETUP 50X90 (CUSTOM PROCEDURE TRAY) ×3 IMPLANT
PAD ARMBOARD 7.5X6 YLW CONV (MISCELLANEOUS) ×6 IMPLANT
STOCKINETTE 4X48 STRL (DRAPES) ×3 IMPLANT
SUT ETHILON 3 0 PS 1 (SUTURE) ×6 IMPLANT
SYR BULB 3OZ (MISCELLANEOUS) ×3 IMPLANT
SYR CONTROL 10ML LL (SYRINGE) ×3 IMPLANT
TOWEL GREEN STERILE (TOWEL DISPOSABLE) ×3 IMPLANT
TOWEL GREEN STERILE FF (TOWEL DISPOSABLE) ×3 IMPLANT
UNDERPAD 30X30 (UNDERPADS AND DIAPERS) ×3 IMPLANT
WATER STERILE IRR 1000ML POUR (IV SOLUTION) IMPLANT

## 2016-08-19 NOTE — Interval H&P Note (Signed)
History and Physical Interval Note:  08/19/2016 7:06 AM  Douglas Aguirre  has presented today for surgery, with the diagnosis of Carpal tunnel syndrome  The various methods of treatment have been discussed with the patient and family. After consideration of risks, benefits and other options for treatment, the patient has consented to  Procedure(s) with comments: RIGHT CARPAL TUNNEL RELEASE (Right) - RIGHT CARPAL TUNNEL RELEASE as a surgical intervention .  The patient's history has been reviewed, patient examined, no change in status, stable for surgery.  I have reviewed the patient's chart and labs.  Questions were answered to the patient's satisfaction.     [name]

## 2016-08-19 NOTE — Brief Op Note (Signed)
08/19/2016  8:35 AM  PATIENT:  Douglas Aguirre  66 y.o. male  PRE-OPERATIVE DIAGNOSIS:  Recurrent Right Carpal tunnel syndrome  POST-OPERATIVE DIAGNOSIS:  Recurrent Right Carpal tunnel syndrome  PROCEDURE:  Procedure(s) with comments: REDO RIGHT CARPAL TUNNEL RELEASE (Right) - REDO RIGHT CARPAL TUNNEL RELEASE  SURGEON:  Surgeon(s) and Role:    Maeola Harman* Gen Clagg, MD - Primary  PHYSICIAN ASSISTANT:   ASSISTANTS: none   ANESTHESIA:   IV sedation  EBL:  Total I/O In: -  Out: 5 [Blood:5]  BLOOD ADMINISTERED:none  DRAINS: none   LOCAL MEDICATIONS USED:  MARCAINE    and LIDOCAINE   SPECIMEN:  No Specimen  DISPOSITION OF SPECIMEN:  N/A  COUNTS:  YES  TOURNIQUET:  * No tourniquets in log *  DICTATION: DICTATION:   Patient was given intravenous sedation and positioned with his right arm on the arm board.  His hand and arm were prepped and draped with betadine scrub and paint and sterile stockinet.  Right wrist was infiltrated with lidocaine and an incision was made over a length of 4 cm in line with the fourth ray through his previous carpal tunnel release incision.  The flexor retinaculum was incised and the carpal tunnel was decompressed.  Exposure of the median nerve was performed sharply with #15 blade.  There appeared to be dense ligamentous compression, which was released on opening the flexor retinaculum.  There appeared to be significant tenosynovitis along flexor tendon sheaths.  Decompression was carried into the volar wrist and into the distal palm.  Hemostasis was assured. The wound was irrigated and closed with 3-0 nylon vertical mattress stitches and dressed with a sterile occlusive dressing with Adaptic, fluff gauze, Kerlix and cling wrap. Patient was taken to recovery in stable and satisfactory condition. Counts were correct at the end of the case.  PLAN OF CARE: Discharge to home after PACU  PATIENT DISPOSITION:  PACU - hemodynamically stable.   Delay start of  Pharmacological VTE agent (>24hrs) due to surgical blood loss or risk of bleeding: yes

## 2016-08-19 NOTE — Op Note (Signed)
08/19/2016  8:35 AM  PATIENT:  Douglas Aguirre  66 y.o. male  PRE-OPERATIVE DIAGNOSIS:  Recurrent Right Carpal tunnel syndrome  POST-OPERATIVE DIAGNOSIS:  Recurrent Right Carpal tunnel syndrome  PROCEDURE:  Procedure(s) with comments: REDO RIGHT CARPAL TUNNEL RELEASE (Right) - REDO RIGHT CARPAL TUNNEL RELEASE  SURGEON:  Surgeon(s) and Role:    * Nayelie Gionfriddo, MD - Primary  PHYSICIAN ASSISTANT:   ASSISTANTS: none   ANESTHESIA:   IV sedation  EBL:  Total I/O In: -  Out: 5 [Blood:5]  BLOOD ADMINISTERED:none  DRAINS: none   LOCAL MEDICATIONS USED:  MARCAINE    and LIDOCAINE   SPECIMEN:  No Specimen  DISPOSITION OF SPECIMEN:  N/A  COUNTS:  YES  TOURNIQUET:  * No tourniquets in log *  DICTATION: DICTATION:   Patient was given intravenous sedation and positioned with his right arm on the arm board.  His hand and arm were prepped and draped with betadine scrub and paint and sterile stockinet.  Right wrist was infiltrated with lidocaine and an incision was made over a length of 4 cm in line with the fourth ray through his previous carpal tunnel release incision.  The flexor retinaculum was incised and the carpal tunnel was decompressed.  Exposure of the median nerve was performed sharply with #15 blade.  There appeared to be dense ligamentous compression, which was released on opening the flexor retinaculum.  There appeared to be significant tenosynovitis along flexor tendon sheaths.  Decompression was carried into the volar wrist and into the distal palm.  Hemostasis was assured. The wound was irrigated and closed with 3-0 nylon vertical mattress stitches and dressed with a sterile occlusive dressing with Adaptic, fluff gauze, Kerlix and cling wrap. Patient was taken to recovery in stable and satisfactory condition. Counts were correct at the end of the case.  PLAN OF CARE: Discharge to home after PACU  PATIENT DISPOSITION:  PACU - hemodynamically stable.   Delay start of  Pharmacological VTE agent (>24hrs) due to surgical blood loss or risk of bleeding: yes  

## 2016-08-19 NOTE — Discharge Instructions (Signed)
Call Dr. Fredrich BirksStern's office [phone] to schedule an appointment for suture removal in 10-14 days. Remove the bandage in 3-4 days. You may then wash the site with soap and water gently You may eat a regular diet when you get home Call Dr. Fredrich BirksStern's office for signs of infection - redness, swelling at the site; pus drainage from the site, fever of 101.5 or higher

## 2016-08-19 NOTE — H&P (Signed)
Patient ID:   [id number] Patient: Douglas Aguirre  Date of Birth: 12-Nov-1950 Visit Type: Office Visit   Date: 08/15/2016 11:15 AM Provider: Danae OrleansJoseph D. Venetia MaxonStern MD   This 66 year old male presents for neck pain.   History of Present Illness: 1.  neck pain  Patient reports occasional mild stiffness in the neck, and improved neck pain. He notes continued pain in the shoulder. He reports "coldness" in his right hand since his hand surgery.         Medical/Surgical/Interim History Reviewed, no change.  Last detailed document date:04/27/2016.     Family History: Reviewed, no changes.  Last detailed document date:04/27/2016.   Social History: Reviewed, no changes. Last detailed document date: 04/27/2016.    MEDICATIONS(added, continued or stopped this visit): Started Medication Directions Instruction Stopped  07/06/2016 hydrocodone 10 mg-acetaminophen 325 mg tablet take 1 tablet by oral route  every 8 - 12 hours as needed for pain     Lortab 5 mg-325 mg tablet take 1 tablet by oral route  every 8 hours as needed for pain    08/21/2013 nortriptyline 50 mg capsule take 1-2 capsule by oral route QHS - BID as needed (90 day supply)       ALLERGIES: Ingredient Reaction Medication Name Comment  NO KNOWN ALLERGIES     No known allergies. Reviewed, no changes.    Vitals Date Temp F BP Pulse Ht In Wt Lb BMI BSA Pain Score  08/15/2016  130/75 74 69 253.2 37.39  4/10      IMPRESSION Patient reports 2 months post-op. He reports occasional stiffness in the neck and improved neck pain. He reports a cold sensation in his right hand. X-ray shows well-placed hardware. On confrontational testing, patient has improved strength in the upper extremities. Patient advised to decrease pain medication that he is taking for his back.  Assessment/Plan # Detail Type Description   1. 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           Pain Management  Plan Pain Scale: 4/10. Method: Numeric Pain Intensity Scale. Location: left shoulder. Onset: 04/28/2014. Pain management follow-up plan of care: Patient taking medication as prescribed..  Fall Risk Plan The patient has not fallen in the last year.  Patient is scheduled for redo carpal tunnel surgery on 08/19/16.             Provider:  Venetia MaxonStern MD, Danae OrleansJoseph D 08/15/2016 11:44 AM  Dictation edited by: Lajoyce LauberLisa Hoyt    CC Providers: Adonis HugueninMichael  Waters [address] [place],  [place]  [phone]   Carlota RaspberryEric Davidson  [address] [place], [place] [phone]-              Electronically signed by Danae OrleansJoseph D. Venetia MaxonStern MD on 08/15/2016 04:46 PM

## 2016-08-19 NOTE — Anesthesia Postprocedure Evaluation (Signed)
Anesthesia Post Note  Patient: DAIDEN COLTRANE  Procedure(s) Performed: Procedure(s) (LRB): RIGHT CARPAL TUNNEL RELEASE (Right)     Patient location during evaluation: PACU Anesthesia Type: MAC Level of consciousness: awake and alert Pain management: pain level controlled Vital Signs Assessment: post-procedure vital signs reviewed and stable Respiratory status: spontaneous breathing, nonlabored ventilation, respiratory function stable and patient connected to nasal cannula oxygen Cardiovascular status: stable and blood pressure returned to baseline Anesthetic complications: no    Last Vitals:  Vitals:   08/19/16 0843 08/19/16 0845  BP: 112/67 120/70  Pulse: 72 74  Resp: 16 14  Temp:      Last Pain:  Vitals:   08/19/16 0643  TempSrc: Oral                 Allana Shrestha EDWARD

## 2016-08-19 NOTE — Transfer of Care (Signed)
Immediate Anesthesia Transfer of Care Note  Patient: Douglas Aguirre  Procedure(s) Performed: Procedure(s) with comments: RIGHT CARPAL TUNNEL RELEASE (Right) - RIGHT CARPAL TUNNEL RELEASE  Patient Location: PACU  Anesthesia Type:MAC  Level of Consciousness: awake, alert , oriented and patient cooperative  Airway & Oxygen Therapy: Patient Spontanous Breathing  Post-op Assessment: Report given to RN and Post -op Vital signs reviewed and stable  Post vital signs: Reviewed and stable  Last Vitals:  Vitals:   08/19/16 0643  BP: 135/60  Pulse: 66  Resp: 18  Temp: 36.4 C    Last Pain:  Vitals:   08/19/16 0643  TempSrc: Oral      Patients Stated Pain Goal: 7 (86/57/84 6962)  Complications: No apparent anesthesia complications

## 2016-08-22 ENCOUNTER — Encounter (HOSPITAL_COMMUNITY): Payer: Self-pay | Admitting: Neurosurgery

## 2016-09-05 ENCOUNTER — Encounter (HOSPITAL_COMMUNITY): Payer: Medicare Other

## 2016-09-05 ENCOUNTER — Inpatient Hospital Stay (HOSPITAL_COMMUNITY)
Admission: AD | Admit: 2016-09-05 | Discharge: 2016-09-09 | DRG: 863 | Disposition: A | Discharge status: Home / Self Care | Payer: Medicare Other | Source: Ambulatory Visit | Attending: Neurosurgery | Admitting: Neurosurgery

## 2016-09-05 ENCOUNTER — Encounter (HOSPITAL_COMMUNITY): Payer: Self-pay | Admitting: General Practice

## 2016-09-05 DIAGNOSIS — K219 Gastro-esophageal reflux disease without esophagitis: Secondary | ICD-10-CM | POA: Diagnosis present

## 2016-09-05 DIAGNOSIS — T814XXD Infection following a procedure, subsequent encounter: Secondary | ICD-10-CM | POA: Diagnosis not present

## 2016-09-05 DIAGNOSIS — M7989 Other specified soft tissue disorders: Secondary | ICD-10-CM | POA: Diagnosis present

## 2016-09-05 DIAGNOSIS — G4730 Sleep apnea, unspecified: Secondary | ICD-10-CM | POA: Diagnosis present

## 2016-09-05 DIAGNOSIS — F329 Major depressive disorder, single episode, unspecified: Secondary | ICD-10-CM | POA: Diagnosis present

## 2016-09-05 DIAGNOSIS — I10 Essential (primary) hypertension: Secondary | ICD-10-CM | POA: Diagnosis present

## 2016-09-05 DIAGNOSIS — E1165 Type 2 diabetes mellitus with hyperglycemia: Secondary | ICD-10-CM | POA: Diagnosis present

## 2016-09-05 DIAGNOSIS — I252 Old myocardial infarction: Secondary | ICD-10-CM

## 2016-09-05 DIAGNOSIS — Z87891 Personal history of nicotine dependence: Secondary | ICD-10-CM | POA: Diagnosis not present

## 2016-09-05 DIAGNOSIS — I2510 Atherosclerotic heart disease of native coronary artery without angina pectoris: Secondary | ICD-10-CM | POA: Diagnosis present

## 2016-09-05 DIAGNOSIS — R609 Edema, unspecified: Secondary | ICD-10-CM | POA: Diagnosis not present

## 2016-09-05 DIAGNOSIS — Z79899 Other long term (current) drug therapy: Secondary | ICD-10-CM | POA: Diagnosis not present

## 2016-09-05 DIAGNOSIS — Z7902 Long term (current) use of antithrombotics/antiplatelets: Secondary | ICD-10-CM | POA: Diagnosis not present

## 2016-09-05 DIAGNOSIS — T814XXA Infection following a procedure, initial encounter: Principal | ICD-10-CM | POA: Diagnosis present

## 2016-09-05 DIAGNOSIS — Z7982 Long term (current) use of aspirin: Secondary | ICD-10-CM | POA: Diagnosis not present

## 2016-09-05 DIAGNOSIS — Y838 Other surgical procedures as the cause of abnormal reaction of the patient, or of later complication, without mention of misadventure at the time of the procedure: Secondary | ICD-10-CM | POA: Diagnosis present

## 2016-09-05 DIAGNOSIS — F419 Anxiety disorder, unspecified: Secondary | ICD-10-CM | POA: Diagnosis present

## 2016-09-05 DIAGNOSIS — Z794 Long term (current) use of insulin: Secondary | ICD-10-CM

## 2016-09-05 DIAGNOSIS — E039 Hypothyroidism, unspecified: Secondary | ICD-10-CM | POA: Diagnosis present

## 2016-09-05 DIAGNOSIS — T8140XA Infection following a procedure, unspecified, initial encounter: Secondary | ICD-10-CM | POA: Diagnosis present

## 2016-09-05 LAB — CBC WITH DIFFERENTIAL/PLATELET
BASOS PCT: 0 %
Basophils Absolute: 0 10*3/uL (ref 0.0–0.1)
EOS ABS: 0.2 10*3/uL (ref 0.0–0.7)
EOS PCT: 2 %
HCT: 37.8 % — ABNORMAL LOW (ref 39.0–52.0)
Hemoglobin: 12.8 g/dL — ABNORMAL LOW (ref 13.0–17.0)
LYMPHS ABS: 3.4 10*3/uL (ref 0.7–4.0)
Lymphocytes Relative: 36 %
MCH: 29.2 pg (ref 26.0–34.0)
MCHC: 33.9 g/dL (ref 30.0–36.0)
MCV: 86.1 fL (ref 78.0–100.0)
Monocytes Absolute: 0.8 10*3/uL (ref 0.1–1.0)
Monocytes Relative: 8 %
Neutro Abs: 5.3 10*3/uL (ref 1.7–7.7)
Neutrophils Relative %: 54 %
PLATELETS: 181 10*3/uL (ref 150–400)
RBC: 4.39 MIL/uL (ref 4.22–5.81)
RDW: 13.2 % (ref 11.5–15.5)
WBC: 9.7 10*3/uL (ref 4.0–10.5)

## 2016-09-05 LAB — COMPREHENSIVE METABOLIC PANEL
ALBUMIN: 3.5 g/dL (ref 3.5–5.0)
ALK PHOS: 51 U/L (ref 38–126)
ALT: 15 U/L — ABNORMAL LOW (ref 17–63)
AST: 14 U/L — ABNORMAL LOW (ref 15–41)
Anion gap: 6 (ref 5–15)
BILIRUBIN TOTAL: 0.7 mg/dL (ref 0.3–1.2)
BUN: 12 mg/dL (ref 6–20)
CALCIUM: 9 mg/dL (ref 8.9–10.3)
CO2: 26 mmol/L (ref 22–32)
CREATININE: 0.75 mg/dL (ref 0.61–1.24)
Chloride: 101 mmol/L (ref 101–111)
Glucose, Bld: 208 mg/dL — ABNORMAL HIGH (ref 65–99)
Potassium: 4.3 mmol/L (ref 3.5–5.1)
SODIUM: 133 mmol/L — AB (ref 135–145)
Total Protein: 6.3 g/dL — ABNORMAL LOW (ref 6.5–8.1)

## 2016-09-05 LAB — GLUCOSE, CAPILLARY
GLUCOSE-CAPILLARY: 172 mg/dL — AB (ref 65–99)
Glucose-Capillary: 195 mg/dL — ABNORMAL HIGH (ref 65–99)

## 2016-09-05 MED ORDER — CLOPIDOGREL BISULFATE 75 MG PO TABS
75.0000 mg | ORAL_TABLET | Freq: Every day | ORAL | Status: DC
  Administered 2016-09-06 – 2016-09-09 (×4): 75 mg via ORAL
  Filled 2016-09-05 (×4): qty 1

## 2016-09-05 MED ORDER — INSULIN ASPART 100 UNIT/ML ~~LOC~~ SOLN
0.0000 [IU] | Freq: Every day | SUBCUTANEOUS | Status: DC
  Administered 2016-09-06 – 2016-09-07 (×2): 3 [IU] via SUBCUTANEOUS
  Administered 2016-09-08: 5 [IU] via SUBCUTANEOUS

## 2016-09-05 MED ORDER — ASPIRIN EC 81 MG PO TBEC
81.0000 mg | DELAYED_RELEASE_TABLET | Freq: Every day | ORAL | Status: DC
  Administered 2016-09-06 – 2016-09-09 (×4): 81 mg via ORAL
  Filled 2016-09-05 (×4): qty 1

## 2016-09-05 MED ORDER — HYDROCODONE-ACETAMINOPHEN 5-325 MG PO TABS: 1.0000 | ORAL_TABLET | ORAL | Status: DC | PRN

## 2016-09-05 MED ORDER — LEVOTHYROXINE SODIUM 50 MCG PO TABS
50.0000 ug | ORAL_TABLET | Freq: Every day | ORAL | Status: DC
  Administered 2016-09-06 – 2016-09-09 (×4): 50 ug via ORAL
  Filled 2016-09-05 (×4): qty 1

## 2016-09-05 MED ORDER — PANTOPRAZOLE SODIUM 40 MG PO TBEC
80.0000 mg | DELAYED_RELEASE_TABLET | Freq: Every day | ORAL | Status: DC
  Administered 2016-09-05 – 2016-09-09 (×5): 80 mg via ORAL
  Filled 2016-09-05 (×5): qty 2

## 2016-09-05 MED ORDER — ALPRAZOLAM 0.5 MG PO TABS
0.5000 mg | ORAL_TABLET | Freq: Two times a day (BID) | ORAL | Status: DC | PRN
  Administered 2016-09-05 – 2016-09-08 (×4): 0.5 mg via ORAL
  Filled 2016-09-05 (×4): qty 1

## 2016-09-05 MED ORDER — SERTRALINE HCL 50 MG PO TABS
50.0000 mg | ORAL_TABLET | Freq: Every day | ORAL | Status: DC
  Administered 2016-09-06 – 2016-09-09 (×4): 50 mg via ORAL
  Filled 2016-09-05 (×4): qty 1

## 2016-09-05 MED ORDER — SIMVASTATIN 40 MG PO TABS
40.0000 mg | ORAL_TABLET | Freq: Every day | ORAL | Status: DC
  Administered 2016-09-05 – 2016-09-08 (×4): 40 mg via ORAL
  Filled 2016-09-05 (×4): qty 1

## 2016-09-05 MED ORDER — HYDROCODONE-ACETAMINOPHEN 10-325 MG PO TABS
1.0000 | ORAL_TABLET | ORAL | Status: DC | PRN
  Administered 2016-09-05 – 2016-09-09 (×12): 1 via ORAL
  Filled 2016-09-05 (×13): qty 1

## 2016-09-05 MED ORDER — FENOFIBRATE 54 MG PO TABS
134.0000 mg | ORAL_TABLET | Freq: Every day | ORAL | Status: DC
  Administered 2016-09-05 – 2016-09-08 (×4): 134 mg via ORAL
  Filled 2016-09-05 (×4): qty 1

## 2016-09-05 MED ORDER — TRAZODONE HCL 100 MG PO TABS
100.0000 mg | ORAL_TABLET | Freq: Every day | ORAL | Status: DC
  Administered 2016-09-05 – 2016-09-08 (×4): 100 mg via ORAL
  Filled 2016-09-05 (×4): qty 1

## 2016-09-05 MED ORDER — RAMIPRIL 5 MG PO CAPS
10.0000 mg | ORAL_CAPSULE | Freq: Every day | ORAL | Status: DC
  Administered 2016-09-06 – 2016-09-09 (×4): 10 mg via ORAL
  Filled 2016-09-05 (×4): qty 2

## 2016-09-05 MED ORDER — DEXTROSE 5 % IV SOLN
1.0000 g | INTRAVENOUS | Status: DC
  Administered 2016-09-05 – 2016-09-08 (×4): 1 g via INTRAVENOUS
  Filled 2016-09-05 (×5): qty 10

## 2016-09-05 MED ORDER — SODIUM CHLORIDE 0.9 % IV SOLN: 250.0000 mL | INTRAVENOUS | Status: DC | PRN

## 2016-09-05 MED ORDER — SODIUM CHLORIDE 0.9% FLUSH: 3.0000 mL | INTRAVENOUS | Status: DC | PRN

## 2016-09-05 MED ORDER — VANCOMYCIN HCL IN DEXTROSE 1-5 GM/200ML-% IV SOLN
1000.0000 mg | Freq: Two times a day (BID) | INTRAVENOUS | Status: DC
  Administered 2016-09-05 – 2016-09-08 (×6): 1000 mg via INTRAVENOUS
  Filled 2016-09-05 (×7): qty 200

## 2016-09-05 MED ORDER — INSULIN ASPART 100 UNIT/ML ~~LOC~~ SOLN
0.0000 [IU] | Freq: Three times a day (TID) | SUBCUTANEOUS | Status: DC
  Administered 2016-09-05: 3 [IU] via SUBCUTANEOUS
  Administered 2016-09-06 (×2): 5 [IU] via SUBCUTANEOUS
  Administered 2016-09-06: 8 [IU] via SUBCUTANEOUS
  Administered 2016-09-07: 11 [IU] via SUBCUTANEOUS
  Administered 2016-09-07: 8 [IU] via SUBCUTANEOUS
  Administered 2016-09-07: 15 [IU] via SUBCUTANEOUS
  Administered 2016-09-08 (×2): 5 [IU] via SUBCUTANEOUS
  Administered 2016-09-08 – 2016-09-09 (×2): 8 [IU] via SUBCUTANEOUS

## 2016-09-05 MED ORDER — SODIUM CHLORIDE 0.9% FLUSH
3.0000 mL | Freq: Two times a day (BID) | INTRAVENOUS | Status: DC
  Administered 2016-09-07: 3 mL via INTRAVENOUS

## 2016-09-05 MED ORDER — CARVEDILOL 6.25 MG PO TABS
6.2500 mg | ORAL_TABLET | Freq: Two times a day (BID) | ORAL | Status: DC
  Administered 2016-09-06 – 2016-09-09 (×7): 6.25 mg via ORAL
  Filled 2016-09-05 (×7): qty 1

## 2016-09-05 MED ORDER — KCL IN DEXTROSE-NACL 20-5-0.45 MEQ/L-%-% IV SOLN
INTRAVENOUS | Status: DC
  Administered 2016-09-05 – 2016-09-07 (×3): via INTRAVENOUS
  Filled 2016-09-05 (×3): qty 1000

## 2016-09-05 MED ORDER — OXYCODONE-ACETAMINOPHEN 5-325 MG PO TABS
1.0000 | ORAL_TABLET | ORAL | Status: DC | PRN
  Administered 2016-09-05: 2 via ORAL
  Administered 2016-09-05: 1 via ORAL
  Administered 2016-09-06 – 2016-09-07 (×4): 2 via ORAL
  Administered 2016-09-07: 1 via ORAL
  Administered 2016-09-07 – 2016-09-09 (×3): 2 via ORAL
  Filled 2016-09-05 (×7): qty 2
  Filled 2016-09-05 (×2): qty 1
  Filled 2016-09-05 (×3): qty 2

## 2016-09-05 NOTE — Progress Notes (Signed)
Pharmacy Antibiotic Note  Douglas Aguirre is a 66 y.o. male admitted on 09/05/2016 with wound infection.  Pharmacy has been consulted for Vancomycin / Ceftriaxone dosing.  Plan: Ceftriaxone 1 gram iv Q 24 hours Vancomycin 1 gram iv Q 12 hours     Temp (24hrs), Avg:98.1 F (36.7 C), Min:98.1 F (36.7 C), Max:98.1 F (36.7 C)  No results for input(s): WBC, CREATININE, LATICACIDVEN, VANCOTROUGH, VANCOPEAK, VANCORANDOM, GENTTROUGH, GENTPEAK, GENTRANDOM, TOBRATROUGH, TOBRAPEAK, TOBRARND, AMIKACINPEAK, AMIKACINTROU, AMIKACIN in the last 168 hours.  CrCl cannot be calculated (Patient's most recent lab result is older than the maximum 21 days allowed.).    Allergies  Allergen Reactions  . Niacin Other (See Comments)    Other Reaction: red and burning   Thank you Okey RegalLisa Denson Niccoli, PharmD [phone] 09/05/2016 2:42 PM

## 2016-09-05 NOTE — Progress Notes (Signed)
New Admission Note:  Arrival Method: arrived on a wheelchair from doctors office Mental Orientation: alert & oriented x 4 Telemetry: n/a Assessment: Completed Skin: patient R hand is swollen and red  IV: L forearm  Pain: patient medicated see MAR Safety Measures: Safety Fall Prevention Plan was given, discussed. Admission: Completed 5M18: Patient has been orientated to the room, unit and the staff. Family: Wife at bedside.   Orders have been reviewed and implemented. Will continue to monitor the patient. Call light has been placed within reach and bed alarm has been activated.   Lawernce IonYari Mena Simonis ,RN

## 2016-09-05 NOTE — Progress Notes (Signed)
Patient's admitting MD [name](Stern) called and asked RN to order patient's home medication's because he was not able to at the moment. RN went over patient medication list while on the phone with the doctor and doctor advised on which medications to reorder with read back. RN re ordered the medications advised.

## 2016-09-06 ENCOUNTER — Inpatient Hospital Stay (HOSPITAL_COMMUNITY): Payer: Medicare Other

## 2016-09-06 DIAGNOSIS — T814XXA Infection following a procedure, initial encounter: Principal | ICD-10-CM

## 2016-09-06 DIAGNOSIS — R609 Edema, unspecified: Secondary | ICD-10-CM

## 2016-09-06 DIAGNOSIS — Y838 Other surgical procedures as the cause of abnormal reaction of the patient, or of later complication, without mention of misadventure at the time of the procedure: Secondary | ICD-10-CM

## 2016-09-06 LAB — GLUCOSE, CAPILLARY
GLUCOSE-CAPILLARY: 202 mg/dL — AB (ref 65–99)
Glucose-Capillary: 259 mg/dL — ABNORMAL HIGH (ref 65–99)
Glucose-Capillary: 239 mg/dL — ABNORMAL HIGH (ref 65–99)
Glucose-Capillary: 270 mg/dL — ABNORMAL HIGH (ref 65–99)

## 2016-09-06 LAB — HIV ANTIBODY (ROUTINE TESTING W REFLEX): HIV Screen 4th Generation wRfx: NONREACTIVE

## 2016-09-06 NOTE — Progress Notes (Addendum)
Subjective: Patient reports "I don't see much difference. It still hurts just as bad"  Objective: Vital signs in last 24 hours: Temp:  [97.9 F (36.6 C)-98.6 F (37 C)] 97.9 F (36.6 C) (07/24 0659) Pulse Rate:  [66-74] 72 (07/24 0659) Resp:  [16] 16 (07/24 0659) BP: (109-122)/(63-80) 109/63 (07/24 0659) SpO2:  [95 %-97 %] 96 % (07/24 0659)  Intake/Output from previous day: No intake/output data recorded. Intake/Output this shift: No intake/output data recorded.  Alert, conversant, in bed with right hand elevated. Little if any decrease in right hand swelling since yesterday. Tenderness to light touch persists. Moves fingers and hand, limited by pain. CTR incision without swelling, erythema, or drainage. Wife recalls pt transferring fish from pond to bait tank the day after sutures were removed and wonders if this could be a possible source of infection.  No Doppler yet. Called to check status; pt will be seen as soon as staff returns from lunch.    Lab Results:  Recent Labs  09/05/16 1446  WBC 9.7  HGB 12.8*  HCT 37.8*  PLT 181   BMET  Recent Labs  09/05/16 1446  NA 133*  K 4.3  CL 101  CO2 26  GLUCOSE 208*  BUN 12  CREATININE 0.75  CALCIUM 9.0    Studies/Results: No results found.  Assessment/Plan:   LOS: 1 day  Awaiting RUE Doppler.  Continue current plan for now.   ID consult request placed. Dr. Luciana Axeomer will call back (late entry 13:55)   Douglas Aguirre, Douglas Aguirre 09/06/2016, 1:16 PM

## 2016-09-06 NOTE — H&P (Signed)
Patient came back to office with swollen and painful right hand.  His incision looked fine in the office when he came back for suture removal.  Reportedly, per his wife, the patient decided to transfer fish from a pond to a bait tank the day afterf his sutures were removed.  He called the [facility] doctor over the weekend and was started on keflex.  He has noted some streaking in his right arm, but currently there is none.  He has tenderness with passive movement of his wrist and fingers.  He is diabetic.  He has not had a fever.  There is no active drainage from his carpal tunnel incision.  I plan to admit the patient to the hospital and start broader antibiotics (Vancomycin and Ceftriaxone).  In light of new information about possible fish contamination and contact with lake/tank water shortly before this occurred, I will ask ID to see patient.  I am also waiting for a Doppler of his right upper extremity to rule out DVT as possible cause of his hand swelling.  We will control his diabetes.    Patient ID:                [id number] Patient:                     Douglas Aguirre                                          Date of Birth:   12/30/1950 Visit Type:                Office Visit                                                         Date:    08/15/2016 11:15 AM Provider:                   Danae Orleans. [name] MD   This 66 year old male presents for neck pain.   History of Present Illness: 1.  neck pain  Patient reports occasional mild stiffness in the neck, and improved neck pain. He notes continued pain in the shoulder. He reports "coldness" in his right hand since his hand surgery.         Medical/Surgical/Interim History Reviewed, no change.  Last detailed document date:04/27/2016.     Family History: Reviewed, no changes.  Last detailed document date:04/27/2016.   Social History: Reviewed, no changes. Last detailed document date: 04/27/2016.    MEDICATIONS(added,  continued or stopped this visit): Started Medication Directions Instruction Stopped  07/06/2016 hydrocodone 10 mg-acetaminophen 325 mg tablet take 1 tablet by oral route  every 8 - 12 hours as needed for pain     Lortab 5 mg-325 mg tablet take 1 tablet by oral route  every 8 hours as needed for pain    08/21/2013 nortriptyline 50 mg capsule take 1-2 capsule by oral route QHS - BID as needed (90 day supply)       ALLERGIES: Ingredient Reaction Medication Name Comment  NO KNOWN ALLERGIES     No known allergies. Reviewed, no changes.    Vitals Date Temp F BP Pulse Ht In  Wt Lb BMI BSA Pain Score  08/15/2016  130/75 74 69 253.2 37.39  4/10      IMPRESSION Patient reports 2 months post-op. He reports occasional stiffness in the neck and improved neck pain. He reports a cold sensation in his right hand. X-ray shows well-placed hardware. On confrontational testing, patient has improved strength in the upper extremities. Patient advised to decrease pain medication that he is taking for his back.  Assessment/Plan # Detail Type Description   1. 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           Pain Management Plan Pain Scale: 4/10. Method: Numeric Pain Intensity Scale. Location: left shoulder. Onset: 04/28/2014. Pain management follow-up plan of care: Patient taking medication as prescribed..  Fall Risk Plan The patient has not fallen in the last year.  Patient is scheduled for redo carpal tunnel surgery on 08/19/16.             Provider:  Venetia MaxonStern MD, Danae OrleansJoseph D 08/15/2016 11:44 AM  Dictation edited by: Lajoyce LauberLisa Hoyt    CC Providers: Adonis HugueninMichael  Waters [address] [place],  [place]  [phone]   Carlota RaspberryEric Davidson  [address] [place], [place] [phone]-              Electronically signed by Danae OrleansJoseph D. Venetia MaxonStern MD on 08/15/2016 04:46 PM

## 2016-09-06 NOTE — Progress Notes (Signed)
   09/06/16 0032  BiPAP/CPAP/SIPAP  BiPAP/CPAP/SIPAP Pt Type Adult  Mask Type Full face mask  BiPAP/CPAP/SIPAP CPAP  Patient Home Equipment No  Auto Titrate Yes

## 2016-09-06 NOTE — Care Management Note (Signed)
Case Management Note  Patient Details  Name: Douglas Aguirre MRN: [id number] Date of Birth: [birth year]  Subjective/Objective:     Pt admitted with carpal tunnel wound infection. He is from home with his spouse.               Action/Plan: Plan is for patient to return home when medically ready. CM following for d/c needs, physician orders.   Expected Discharge Date:                  Expected Discharge Plan:     In-House Referral:     Discharge planning Services     Post Acute Care Choice:    Choice offered to:     DME Arranged:    DME Agency:     HH Arranged:    HH Agency:     Status of Service:  In process, will continue to follow  If discussed at Long Length of Stay Meetings, dates discussed:    Additional Comments:  Kermit BaloKelli F Rodrickus Min, RN 09/06/2016, 3:26 PM

## 2016-09-06 NOTE — Progress Notes (Signed)
*  Preliminary Results* Right upper extremity venous duplex completed. Right upper extremity is negative for deep and superficial vein thrombosis.  09/06/2016 2:14 PM  Gertie FeyMichelle Hadas Jessop, BS, RVT, RDCS, RDMS

## 2016-09-06 NOTE — Consult Note (Signed)
[facility]       Reason for Consult: post op hand infection    Referring Physician: Dr. [name]  Active Problems:   Post op infection   . aspirin EC  81 mg Oral Daily  . carvedilol  6.25 mg Oral BID WC  . clopidogrel  75 mg Oral Daily  . fenofibrate  134 mg Oral QHS  . insulin aspart  0-15 Units Subcutaneous TID WC  . insulin aspart  0-5 Units Subcutaneous QHS  . levothyroxine  50 mcg Oral QAC breakfast  . pantoprazole  80 mg Oral Daily  . ramipril  10 mg Oral Daily  . sertraline  50 mg Oral Daily  . simvastatin  40 mg Oral QHS  . sodium chloride flush  3 mL Intravenous Q12H  . traZODone  100 mg Oral QHS    Recommendations: Continue with vancomycin and ceftriaxone  Will add metronidazole  Assessment: He has a sudden hand infection the day after suture removal.  He reported he had transferred fish from a pond to a bait tank.  He was started on Keflex but continued to worsen.  He did report a fever.  With a negative doppler, I suspect infection.  Fresh-water exposure does broaden the microbiologic differential so I will add metronidazole.  Other organisms would be covered by ceftriaxone.    HIV negative  Antibiotics: Vancomycin day 1 Ceftriaxone day 1  HPI: Douglas Aguirre is a 66 y.o. male with carpal tunnel syndrome with recent redo carpel tunnel release on 7/6 who came in with sudden hand swelling.  He had done well post op and had his sutures removed and the following day the hand began to swell, was painful and associated with a fever.  Some slight drainage that was yellow in color.  Some redness and pain is worsening.  He was brought in to the hospital for empiric antibiotic treatment.  The day after the sutures were removed he transferred pond fish to a bait tank with an uncovered hand prior to the swelling.  No associated rash.    Review of Systems:  Constitutional: negative for anorexia Gastrointestinal: negative for nausea and  diarrhea Integument/breast: negative for rash All other systems reviewed and are negative    Past Medical History:  Diagnosis Date  . Anxiety   . Arthritis    degenerative joints, back- upper & lower   . Coronary artery disease   . Depression   . Diabetes mellitus without complication (HCC)   . GERD (gastroesophageal reflux disease)   . History of hiatal hernia   . History of kidney stones    passed spontaneously, & has cystoscopy & lithotripsy  . Hypertension   . Hypothyroidism   . Myocardial infarction (HCC)   . Neuromuscular disorder (HCC)    carpal tunnel syndrome - both hands   . Sleep apnea 2013   last study- cpap    Social History  Substance Use Topics  . Smoking status: Former Smoker    Quit date: 06/16/2011  . Smokeless tobacco: Never Used  . Alcohol use No    FMH: + cardiac disease  Allergies  Allergen Reactions  . Niacin Other (See Comments)    Other Reaction: red and burning    Physical Exam: Constitutional: in no apparent distress and alert  Vitals:   09/06/16 0659 09/06/16 1000  BP: 109/63 115/68  Pulse: 72 78  Resp: 16 18  Temp: 97.9 F (36.6 C) 97.9 F (36.6  C)   EYES: anicteric ENMT: no thrush Cardiovascular: Cor RRR Respiratory: CTA B; normal respiratory effort GI: Bowel sounds are normal, liver is not enlarged, spleen is not enlarged, soft, nt Musculoskeletal: no pedal edema noted Skin: negatives: no rash  Lab Results  Component Value Date   WBC 9.7 09/05/2016   HGB 12.8 (L) 09/05/2016   HCT 37.8 (L) 09/05/2016   MCV 86.1 09/05/2016   PLT 181 09/05/2016    Lab Results  Component Value Date   CREATININE 0.75 09/05/2016   BUN 12 09/05/2016   NA 133 (L) 09/05/2016   K 4.3 09/05/2016   CL 101 09/05/2016   CO2 26 09/05/2016    Lab Results  Component Value Date   ALT 15 (L) 09/05/2016   AST 14 (L) 09/05/2016   ALKPHOS 51 09/05/2016     Microbiology: No results found for this or any previous visit (from the past 240  hour(s)).  Staci RighterOMER, ROBERT, MD [facility] [email or web address] [phone] pager  [phone] cell 09/06/2016, 2:53 PM

## 2016-09-06 NOTE — Progress Notes (Signed)
Inpatient Diabetes Program Recommendations  AACE/ADA: New Consensus Statement on Inpatient Glycemic Control (2015)  Target Ranges:  Prepandial:   less than 140 mg/dL      Peak postprandial:   less than 180 mg/dL (1-2 hours)      Critically ill patients:  140 - 180 mg/dL   Lab Results  Component Value Date   GLUCAP 239 (H) 09/06/2016   HGBA1C 8.9 (H) 08/10/2016    Review of Glycemic Control:  Results for Rae RoamDAVIS, Ector L (MRN [id number]) as of 09/06/2016 15:13  Ref. Range 09/05/2016 16:42 09/05/2016 21:20 09/06/2016 06:25 09/06/2016 12:05  Glucose-Capillary Latest Ref Range: 65 - 99 mg/dL 132172 (H) 440195 (H) 102259 (H) 239 (H)   Diabetes history: Type 2 diabetes Outpatient Diabetes medications: Levemir 100 units daily, Janumet 50-1000 mg daily, Amaryl 4 mg bid Current orders for Inpatient glycemic control: Novolog moderate tid with meals and HS  Inpatient Diabetes Program Recommendations:   Note history of diabetes.  Please restart at least 1/2 of home dose of insulin.  Consider Levemir 50 units daily.  Thanks, Beryl MeagerJenny Shloka Baldridge, RN, BC-ADM Inpatient Diabetes Coordinator Pager [phone] (8a-5p)

## 2016-09-07 ENCOUNTER — Encounter (HOSPITAL_COMMUNITY): Payer: Self-pay

## 2016-09-07 DIAGNOSIS — T814XXD Infection following a procedure, subsequent encounter: Secondary | ICD-10-CM

## 2016-09-07 LAB — GLUCOSE, CAPILLARY
GLUCOSE-CAPILLARY: 282 mg/dL — AB (ref 65–99)
Glucose-Capillary: 336 mg/dL — ABNORMAL HIGH (ref 65–99)
Glucose-Capillary: 427 mg/dL — ABNORMAL HIGH (ref 65–99)
Glucose-Capillary: 299 mg/dL — ABNORMAL HIGH (ref 65–99)

## 2016-09-07 MED ORDER — METRONIDAZOLE 500 MG PO TABS
500.0000 mg | ORAL_TABLET | Freq: Three times a day (TID) | ORAL | Status: DC
  Administered 2016-09-07 – 2016-09-09 (×6): 500 mg via ORAL
  Filled 2016-09-07 (×6): qty 1

## 2016-09-07 MED ORDER — INSULIN DETEMIR 100 UNIT/ML ~~LOC~~ SOLN
100.0000 [IU] | Freq: Every day | SUBCUTANEOUS | Status: DC
  Administered 2016-09-07 – 2016-09-09 (×3): 100 [IU] via SUBCUTANEOUS
  Filled 2016-09-07 (×3): qty 1

## 2016-09-07 MED ORDER — GLIMEPIRIDE 4 MG PO TABS
4.0000 mg | ORAL_TABLET | Freq: Two times a day (BID) | ORAL | Status: DC
  Administered 2016-09-07 – 2016-09-09 (×4): 4 mg via ORAL
  Filled 2016-09-07 (×4): qty 1

## 2016-09-07 NOTE — Progress Notes (Signed)
Pt. seen for CPAP, is able to place on himself, made aware to notify if help needed,RT to monitor.

## 2016-09-07 NOTE — Progress Notes (Addendum)
Subjective: Patient reports "I think it's getting better, but it still hurts"  Objective: Vital signs in last 24 hours: Temp:  [97.9 F (36.6 C)-98.9 F (37.2 C)] 98.9 F (37.2 C) (07/25 0532) Pulse Rate:  [64-78] 75 (07/25 0532) Resp:  [18] 18 (07/25 0532) BP: (113-125)/(62-76) 113/75 (07/25 0532) SpO2:  [94 %-99 %] 94 % (07/25 0532)  Intake/Output from previous day: 07/24 0701 - 07/25 0700 In: 900 [I.V.:900] Out: -  Intake/Output this shift: No intake/output data recorded.  Alert, conversant. Moving fingers right hand some better. Decreased swelling right hand. Incision without erythema, swelling, or drainage. Doppler Negative.   Lab Results:  Recent Labs  09/05/16 1446  WBC 9.7  HGB 12.8*  HCT 37.8*  PLT 181   BMET  Recent Labs  09/05/16 1446  NA 133*  K 4.3  CL 101  CO2 26  GLUCOSE 208*  BUN 12  CREATININE 0.75  CALCIUM 9.0    Studies/Results: No results found.  Assessment/Plan: Improving  LOS: 2 days  Appreciate Dr. Ephriam Knucklesomer's assistance.  With decreased swelling, unlikely to require surgery. Plan for now is to monitor hand, expecting resolution of swelling. Continuing antibiotics Vancomycin, Ceftriaxone, and Metronidazole.  Duration of antibiotics to be discussed.  Will resume home diabetes med regimen - pt does not remember meds at present.    Georgiann Cockeroteat, Brian 09/07/2016, 7:34 AM   Patient knows not to fish or put hand in pond immediately after sugery.

## 2016-09-07 NOTE — Progress Notes (Signed)
    [facility]   Reason for visit: Follow up on hand swelling  Interval History: some improvement in swelling, no fever, no chills; no associated rash, no diarrhea  Physical Exam: Constitutional:  Vitals:   09/07/16 0532 09/07/16 1032  BP: 113/75 120/71  Pulse: 75 75  Resp: 18 18  Temp: 98.9 F (37.2 C) 98.4 F (36.9 C)   patient appears in NAD Respiratory: Normal respiratory effort; CTA B Cardiovascular: RRR GI: soft, nt, nd MS: hand with closed incision area, no drainage, dry  Review of Systems: Constitutional: negative for fevers, chills and malaise Gastrointestinal: negative for nausea and diarrhea Integument/breast: negative for rash  Lab Results  Component Value Date   WBC 9.7 09/05/2016   HGB 12.8 (L) 09/05/2016   HCT 37.8 (L) 09/05/2016   MCV 86.1 09/05/2016   PLT 181 09/05/2016    Lab Results  Component Value Date   CREATININE 0.75 09/05/2016   BUN 12 09/05/2016   NA 133 (L) 09/05/2016   K 4.3 09/05/2016   CL 101 09/05/2016   CO2 26 09/05/2016    Lab Results  Component Value Date   ALT 15 (L) 09/05/2016   AST 14 (L) 09/05/2016   ALKPHOS 51 09/05/2016     Microbiology: No results found for this or any previous visit (from the past 240 hour(s)).  Impression/Plan:  1. Hand infection - some improvement, on broad antibiotics.  Will continue with the same and monitor.   2. Medication monitoring - will moniitor creat, vanco levels

## 2016-09-07 NOTE — Progress Notes (Addendum)
Patient ID: Douglas Aguirre, male   DOB: 1950/09/28, 66 y.o.   MRN: [id number] Pt's home diabetes meds added (Amaryl 4mg  bid & Levemir Flexpen 100u/ml qam).  Unable to find in formulary Victoza 18mg /763ml 0.6mg  inj HS; also unable to find Janumet 50/100 mg tab 1 bid.  Diabetes coordinator or pharmacy may have suggestions. We appreciate their assistance.

## 2016-09-08 LAB — BASIC METABOLIC PANEL
ANION GAP: 8 (ref 5–15)
BUN: 14 mg/dL (ref 6–20)
CALCIUM: 9.1 mg/dL (ref 8.9–10.3)
CHLORIDE: 100 mmol/L — AB (ref 101–111)
CO2: 27 mmol/L (ref 22–32)
Creatinine, Ser: 0.78 mg/dL (ref 0.61–1.24)
GFR calc Af Amer: >60 mL/min (ref >60)
GFR calc non Af Amer: >60 mL/min (ref >60)
GLUCOSE: 174 mg/dL — AB (ref 65–99)
Potassium: 4 mmol/L (ref 3.5–5.1)
Sodium: 135 mmol/L (ref 135–145)

## 2016-09-08 LAB — GLUCOSE, CAPILLARY
GLUCOSE-CAPILLARY: 246 mg/dL — AB (ref 65–99)
GLUCOSE-CAPILLARY: 284 mg/dL — AB (ref 65–99)
Glucose-Capillary: 206 mg/dL — ABNORMAL HIGH (ref 65–99)
Glucose-Capillary: 359 mg/dL — ABNORMAL HIGH (ref 65–99)

## 2016-09-08 LAB — VANCOMYCIN, TROUGH: VANCOMYCIN TR: 7 ug/mL — AB (ref 15–20)

## 2016-09-08 MED ORDER — VANCOMYCIN HCL 10 G IV SOLR
1500.0000 mg | Freq: Two times a day (BID) | INTRAVENOUS | Status: DC
  Administered 2016-09-08 – 2016-09-09 (×2): 1500 mg via INTRAVENOUS
  Filled 2016-09-08 (×2): qty 1500

## 2016-09-08 NOTE — Progress Notes (Addendum)
Subjective: Patient reports "It just hurts"  Objective: Vital signs in last 24 hours: Temp:  [97.9 F (36.6 C)-99 F (37.2 C)] 97.9 F (36.6 C) (07/26 0503) Pulse Rate:  [66-80] 68 (07/26 0503) Resp:  [18] 18 (07/26 0503) BP: (112-130)/(59-71) 124/60 (07/26 0503) SpO2:  [95 %-99 %] 99 % (07/26 0503)  Intake/Output from previous day: No intake/output data recorded. Intake/Output this shift: No intake/output data recorded.  Ambulates from bathroom.  Inspection of right hand reveals similar appearance to yesterday, possibly some continued decrease in swelling. Pain limits movement of hand and fingers - without change per pt. Incision remains free of erythema, swelling, or drainage.   Lab Results:  Recent Labs  09/05/16 1446  WBC 9.7  HGB 12.8*  HCT 37.8*  PLT 181   BMET  Recent Labs  09/05/16 1446 09/08/16 0406  NA 133* 135  K 4.3 4.0  CL 101 100*  CO2 26 27  GLUCOSE 208* 174*  BUN 12 14  CREATININE 0.75 0.78  CALCIUM 9.0 9.1    Studies/Results: No results found.  Assessment/Plan:   LOS: 3 days  Continue observation of hand swelling and movement. Encouraged continued elevation. Continue antibiotics.    Georgiann Cockeroteat, Brian 09/08/2016, 8:04 AM   Patient does not take good care of himself.  We are trying to reinforce diabetes care with patient and his wife.

## 2016-09-08 NOTE — Progress Notes (Signed)
    [facility]   Reason for visit: Follow up on hand swelling  Interval History: he feels much more improvement in hand swelling compared to yesterday; no fever, no chills.  No associated n/v/d.  Physical Exam: Constitutional:  Vitals:   09/08/16 0920 09/08/16 1006  BP:  139/69  Pulse: 75 73  Resp:  19  Temp:  98.2 F (36.8 C)   patient appears in NAD Respiratory: Normal respiratory effort; CTA B Cardiovascular: RRR MS: hand with closed incision area, no drainage, dry, decreased swelling  Review of Systems: Constitutional: negative for fevers, chills and malaise Gastrointestinal: negative for nausea and diarrhea Integument/breast: negative for rash  Lab Results  Component Value Date   WBC 9.7 09/05/2016   HGB 12.8 (L) 09/05/2016   HCT 37.8 (L) 09/05/2016   MCV 86.1 09/05/2016   PLT 181 09/05/2016    Lab Results  Component Value Date   CREATININE 0.78 09/08/2016   BUN 14 09/08/2016   NA 135 09/08/2016   K 4.0 09/08/2016   CL 100 (L) 09/08/2016   CO2 27 09/08/2016    Lab Results  Component Value Date   ALT 15 (L) 09/05/2016   AST 14 (L) 09/05/2016   ALKPHOS 51 09/05/2016     Microbiology: No results found for this or any previous visit (from the past 240 hour(s)).  Impression/Plan:  1. Hand infection - continued improvement so far, on broad antibiotics.  Will continue with the same and monitor.  If he has continued improvement, will consider similar broad oral regimen for discharge when deemed ready per Dr. Venetia MaxonStern (doxy + Augmentin for example).    2. Medication monitoring - will moniitor creat, vanco levels

## 2016-09-08 NOTE — Progress Notes (Signed)
Pt. seen for CPAP, is able to self administer, made aware to notify if help needed,RT to monitor.

## 2016-09-08 NOTE — Progress Notes (Signed)
Pharmacy Antibiotic Note  Douglas Aguirre is a 66 y.o. male admitted on 09/05/2016 with a hand wound infection s/p carpel tunnel syndrome release. Of note, pt had his hand in pond water the day after sutures were removed.  Pharmacy has been consulted for vancomycin and ceftriaxone dosing. Pt is afebrile, WBC WNL. Scr stable, 0.78 today.  Plan: Continue ceftriaxone 1g IV q24h  Continue vancomycin 1g IV q12h Pending ID recs, will obtain vancomycin trough level  F/u LOT   Temp (24hrs), Avg:98.5 F (36.9 C), Min:97.9 F (36.6 C), Max:99 F (37.2 C)   Recent Labs Lab 09/05/16 1446 09/08/16 0406  WBC 9.7  --   CREATININE 0.75 0.78    CrCl cannot be calculated (Unknown ideal weight.).    Allergies  Allergen Reactions  . Niacin Other (See Comments)    Other Reaction: red and burning    Antimicrobials this admission: Ceftriaxone 7/23 >>  Vancomycin 7/23 >>  Metronidazole 7/25 >>  Thank you for allowing pharmacy to be a part of this patient's care.   Douglas Aguirre, PharmD PGY1 Pharmacy Resident Pager: [phone]  I discussed / reviewed the pharmacy note by Dr. Cena BentonVega and I agree with the resident's findings and plans as documented. Douglas Aguirre, PharmD [phone]   09/08/2016 9:00 AM

## 2016-09-08 NOTE — Progress Notes (Signed)
Pharmacy Antibiotic Note  Douglas Aguirre is a 66 y.o. male admitted on 09/05/2016 with a hand wound infection s/p carpel tunnel syndrome release. Of note, pt had his hand in pond water the day after sutures were removed.  Pharmacy has been consulted for vancomycin and ceftriaxone dosing. Pt is afebrile, WBC WNL. Scr stable, 0.78 today.  Vancomycin trough this afternoon = 7, low  Plan: Continue ceftriaxone 1g IV q24h  Increase vancomycin to 1500 mg iv Q 12 hours Continue metronidazole 500 mg po Q 8 hours F/u for change to po soon   Temp (24hrs), Avg:98.4 F (36.9 C), Min:97.9 F (36.6 C), Max:99 F (37.2 C)   Recent Labs Lab 09/05/16 1446 09/08/16 0406 09/08/16 1429  WBC 9.7  --   --   CREATININE 0.75 0.78  --   VANCOTROUGH  --   --  7*    CrCl cannot be calculated (Unknown ideal weight.).    Allergies  Allergen Reactions  . Niacin Other (See Comments)    Other Reaction: red and burning    Antimicrobials this admission: Ceftriaxone 7/23 >>  Vancomycin 7/23 >>  Metronidazole 7/25 >>  Thank you for allowing pharmacy to be a part of this patient's care. Okey RegalLisa Lourene Hoston, PharmD [phone]   09/08/2016 3:24 PM

## 2016-09-09 LAB — GLUCOSE, CAPILLARY: Glucose-Capillary: 282 mg/dL — ABNORMAL HIGH (ref 65–99)

## 2016-09-09 MED ORDER — AMOXICILLIN-POT CLAVULANATE 875-125 MG PO TABS
1.0000 | ORAL_TABLET | Freq: Two times a day (BID) | ORAL | Status: DC
  Administered 2016-09-09: 1 via ORAL
  Filled 2016-09-09: qty 1

## 2016-09-09 MED ORDER — DOXYCYCLINE HYCLATE 100 MG PO TABS
100.0000 mg | ORAL_TABLET | Freq: Two times a day (BID) | ORAL | Status: DC
  Administered 2016-09-09: 100 mg via ORAL
  Filled 2016-09-09: qty 1

## 2016-09-09 NOTE — Discharge Summary (Signed)
Physician Discharge Summary  Patient ID: Douglas RoamDanny L Marik MRN: [id number] DOB/AGE: Mar 04, 1950 66 y.o.  Admit date: 09/05/2016 Discharge date: 09/09/2016  Admission Diagnoses:Hand infection, poorly controlled diabetes  Discharge Diagnoses: Same Active Problems:   Post op infection   Discharged Condition: fair  Hospital Course: Patient was admitted with hand swelling and tenderness after carpal tunnel release following submerging his freshly operated hand in a pond and transferring brim to a bait bucket.  He was placed on broad spectrum antibiotics.  Doppler of his right upper extremity was negative for DVT.  He had gradual improvement in hand pain and swelling.  He was seen by Diabetes Care Coordinator and advised to improve diabetes care and compliance on leaving the hospital.  Per Dr. Luciana Axeomer of ID, patient was discharged home on Augmentin and Doxycycline for ten days and encouraged to continue to elevate his hand.  Consults: ID and Diabetes Care  Significant Diagnostic Studies: Doppler of right upper extremity negative for DVT.  Treatments: antibiotics: vancomycin, metronidazole and Rocephin  Discharge Exam: Blood pressure 117/72, pulse 67, temperature 98.2 F (36.8 C), temperature source Oral, resp. rate 17, SpO2 94 %. Neurologic: Alert and oriented X 3, normal strength and tone. Normal symmetric reflexes. Normal coordination and gait Wound:Inflamed, not actively draining  Disposition: Home  Discharge Instructions    Diet - low sodium heart healthy    Complete by:  As directed    Increase activity slowly    Complete by:  As directed      Allergies as of 09/09/2016      Reactions   Niacin Other (See Comments)   Other Reaction: red and burning      Medication List    TAKE these medications   ALPRAZolam 0.5 MG tablet Commonly known as:  XANAX Take 0.5 mg by mouth 2 (two) times daily as needed for anxiety.   aspirin EC 81 MG tablet Take 81 mg by mouth daily.    carvedilol 6.25 MG tablet Commonly known as:  COREG Take 6.25 mg by mouth 2 (two) times daily.   cephALEXin 500 MG capsule Commonly known as:  KEFLEX Take 500 mg by mouth every 8 (eight) hours.   clopidogrel 75 MG tablet Commonly known as:  PLAVIX Take 75 mg by mouth every evening.   fenofibrate micronized 134 MG capsule Commonly known as:  LOFIBRA Take 134 mg by mouth at bedtime.   glimepiride 4 MG tablet Commonly known as:  AMARYL Take 4 mg by mouth 2 (two) times daily.   GOODYS EXTRA STRENGTH 500-325-65 MG Pack Generic drug:  Aspirin-Acetaminophen-Caffeine Take 1 packet by mouth daily as needed (for headaches.).   HYDROcodone-acetaminophen 10-325 MG tablet Commonly known as:  NORCO Take 1-2 tablets by mouth every 4 (four) hours as needed for severe pain. What changed:  how much to take  when to take this   LEVEMIR FLEXPEN 100 UNIT/ML Pen Generic drug:  Insulin Detemir Inject 100 Units into the skin every morning.   levothyroxine 50 MCG tablet Commonly known as:  SYNTHROID, LEVOTHROID Take 50 mcg by mouth daily before breakfast.   multivitamin with minerals Tabs tablet Take 1 tablet by mouth daily.   nitroGLYCERIN 0.4 MG SL tablet Commonly known as:  NITROSTAT Place 0.4 mg under the tongue every 5 (five) minutes as needed for chest pain.   omeprazole 40 MG capsule Commonly known as:  PRILOSEC Take 40 mg by mouth 2 (two) times daily.   PRESCRIPTION MEDICATION Take 10 mg by mouth every  other day. DOMPERIDONE 10 MG   ramipril 10 MG capsule Commonly known as:  ALTACE Take 10 mg by mouth daily.   sertraline 50 MG tablet Commonly known as:  ZOLOFT Take 50 mg by mouth daily.   simvastatin 40 MG tablet Commonly known as:  ZOCOR Take 40 mg by mouth at bedtime.   sitaGLIPtin-metformin 50-1000 MG tablet Commonly known as:  JANUMET Take 1 tablet by mouth 2 (two) times daily with a meal.   traZODone 100 MG tablet Commonly known as:  DESYREL Take 100 mg  by mouth at bedtime.   VICTOZA 18 MG/3ML Sopn Generic drug:  liraglutide Inject 0.6 mg into the skin at bedtime.      Patient was E-prescribed Augmentin 500/125 BID for 14 days, Doxycycline 100 BID for 14 days, Norco 10/325 for pain  Signed: Dorian HeckleSTERN,Jadelin Eng D, MD 09/09/2016, 9:36 AM

## 2016-09-09 NOTE — Progress Notes (Signed)
RN discussed discharge instructions with patient including changes to medications, understands new antibiotics. Patient states he has no more vicodin to take at home. Left message with Dr. Fredrich BirksStern's medical assistant. Patient understands infection prevention. Patient's neuro assessment  Unchanged. Patient waiting for ride home and answer from dr. Venetia MaxonStern regarding pain medications to take at home

## 2016-09-09 NOTE — Progress Notes (Signed)
Subjective: Patient reports doing better  Objective: Vital signs in last 24 hours: Temp:  [97.7 F (36.5 C)-98.4 F (36.9 C)] 97.7 F (36.5 C) (07/27 0529) Pulse Rate:  [62-73] 65 (07/27 0529) Resp:  [18-19] 18 (07/27 0529) BP: (100-139)/(40-69) 131/56 (07/27 0529) SpO2:  [94 %-97 %] 97 % (07/27 0529)  Intake/Output from previous day: No intake/output data recorded. Intake/Output this shift: No intake/output data recorded.  Physical Exam: Hand swelling is improving, as is tenderness.  No drainage.  Lab Results: No results for input(s): WBC, HGB, HCT, PLT in the last 72 hours. BMET  Recent Labs  09/08/16 0406  NA 135  K 4.0  CL 100*  CO2 27  GLUCOSE 174*  BUN 14  CREATININE 0.78  CALCIUM 9.1    Studies/Results: No results found.  Assessment/Plan: Discharge home on po Doxycycline and augmentin per ID.  Patient understands not to put his hand in the lake, a pond, a bait bucket, or to handle fish until it is fully healed.  He also knows he needs to take better care of himself with regard to his diabetes and that this includes dietary compliance and blood sugar monitoring.    LOS: 4 days    Dorian HeckleSTERN,Gianny Killman D, MD 09/09/2016, 9:32 AM

## 2016-09-09 NOTE — Progress Notes (Signed)
Per Dr. Fredrich BirksStern's nurse, prescriptions were escribed to pharmacy. Patient aware.

## 2016-09-09 NOTE — Progress Notes (Addendum)
Inpatient Diabetes Program Recommendations  AACE/ADA: New Consensus Statement on Inpatient Glycemic Control (2015)  Target Ranges:  Prepandial:   less than 140 mg/dL      Peak postprandial:   less than 180 mg/dL (1-2 hours)      Critically ill patients:  140 - 180 mg/dL   Lab Results  Component Value Date   GLUCAP 282 (H) 09/09/2016   HGBA1C 8.9 (H) 08/10/2016    Review of Glycemic Control Results for Douglas Aguirre, Campbell L (MRN [id number]) as of 09/09/2016 08:03  Ref. Range 09/08/2016 06:15 09/08/2016 11:14 09/08/2016 16:48 09/08/2016 21:09 09/09/2016 06:22  Glucose-Capillary Latest Ref Range: 65 - 99 mg/dL 409206 (H) 811246 (H) 914284 (H) 359 (H) 282 (H)   Diabetes history: Type 2 diabetes Outpatient Diabetes medications: Levemir 100 units daily, Janumet 50-1000 mg daily, Amaryl 4 mg bid Current orders for Inpatient glycemic control: Levemir 100 units qd + Amaryl bid + Novolog moderate tid with meals and HS  Inpatient Diabetes Program Recommendations:  Noted CBGs >200.  Please consider: -Increase Novolog correction to resistant tid + 0-5 units hs -Change Levemir to 50 units q 12 hrs. instead of daily  Thank you, Billy FischerJudy E. Amyra Vantuyl, RN, MSN, CDE  Diabetes Coordinator Inpatient Glycemic Control Team Team Pager [phone] (8am-5pm) 09/09/2016 8:07 AM

## 2016-09-09 NOTE — Care Management Note (Signed)
Case Management Note  Patient Details  Name: Douglas Aguirre MRN: [id number] Date of Birth: 1950-09-16  Subjective/Objective:                    Action/Plan: Pt discharging to home with self care. Pt has PCP, insurance and transportation home. No further needs per CM.   Expected Discharge Date:  09/09/16               Expected Discharge Plan:  Home/Self Care  In-House Referral:     Discharge planning Services     Post Acute Care Choice:    Choice offered to:     DME Arranged:    DME Agency:     HH Arranged:    HH Agency:     Status of Service:  Completed, signed off  If discussed at [employer] Length of Stay Meetings, dates discussed:    Additional Comments:  Kermit BaloKelli F Cesare Sumlin, RN 09/09/2016, 11:25 AM

## 2016-09-15 NOTE — Progress Notes (Signed)
Patient's diabetes reflects: Uncontrolled diabetes with hyperglycemia

## 2016-09-22 ENCOUNTER — Other Ambulatory Visit: Payer: Self-pay | Admitting: Pain Medicine

## 2016-09-22 DIAGNOSIS — M545 Low back pain, unspecified: Secondary | ICD-10-CM

## 2020-09-17 ENCOUNTER — Other Ambulatory Visit: Payer: Self-pay | Admitting: Neurosurgery

## 2020-09-17 DIAGNOSIS — M542 Cervicalgia: Secondary | ICD-10-CM

## 2020-11-02 ENCOUNTER — Other Ambulatory Visit: Payer: Self-pay

## 2020-11-02 ENCOUNTER — Ambulatory Visit
Admission: RE | Admit: 2020-11-02 | Discharge: 2020-11-02 | Disposition: A | Discharge status: Home / Self Care | Payer: Medicare Other | Source: Ambulatory Visit | Attending: Neurosurgery | Admitting: Neurosurgery

## 2020-11-02 DIAGNOSIS — M542 Cervicalgia: Secondary | ICD-10-CM

## 2020-12-02 ENCOUNTER — Encounter: Payer: Self-pay | Admitting: Podiatry

## 2020-12-02 ENCOUNTER — Ambulatory Visit (INDEPENDENT_AMBULATORY_CARE_PROVIDER_SITE_OTHER): Payer: Medicare Other

## 2020-12-02 ENCOUNTER — Ambulatory Visit: Payer: Medicare Other | Admitting: Podiatry

## 2020-12-02 ENCOUNTER — Other Ambulatory Visit: Payer: Self-pay

## 2020-12-02 DIAGNOSIS — M722 Plantar fascial fibromatosis: Secondary | ICD-10-CM | POA: Diagnosis not present

## 2020-12-02 MED ORDER — TRIAMCINOLONE ACETONIDE 10 MG/ML IJ SUSP
10.0000 mg | Freq: Once | INTRAMUSCULAR | Status: AC
  Administered 2020-12-02: 10 mg

## 2020-12-02 NOTE — Progress Notes (Signed)
Subjective:   Patient ID: Douglas Aguirre, male   DOB: 70 y.o.   MRN: [id number]   HPI Patient presents with exquisite discomfort in the right plantar heel of 3 months duration and states it sharp with some occasional burning sensations.  Patient states he feels like he is walking differently and developing some other discomforts and patient does not smoke likes to be active   Review of Systems  All other systems reviewed and are negative.      Objective:  Physical Exam Vitals and nursing note reviewed.  Constitutional:      Appearance: He is well-developed.  Pulmonary:     Effort: Pulmonary effort is normal.  Musculoskeletal:        General: Normal range of motion.  Skin:    General: Skin is warm.  Neurological:     Mental Status: He is alert.    Neurovascular status intact muscle strength adequate range of motion within normal limits with patient found to have exquisite discomfort plantar aspect right heel at the insertional point of the tendon into the calcaneus with inflammation fluid around the medial band.  Good digital perfusion well oriented x3     Assessment:  Acute plantar fasciitis right with probable compensatory discomfort into the arch and forefoot     Plan:  H&P x-rays reviewed sterile prep injected the right plantar fascia 3 mg Kenalog 5 mg Xylocaine applied fascial brace to lift up the arch instructed on anti-inflammatories as needed topical and shoe gear modifications recheck again in 2 weeks  X-rays indicate that there is a large plantar spur no indication stress fracture or advanced arthritis

## 2020-12-02 NOTE — Patient Instructions (Signed)

## 2020-12-16 ENCOUNTER — Ambulatory Visit: Payer: Medicare Other | Admitting: Podiatry

## 2021-10-02 IMAGING — MR MR CERVICAL SPINE W/O CM
5 series · 28 of 48 positions shown · non-contrast
Comparison: MRI of the cervical spine 09/18/2018.

CLINICAL DATA: Neck pain, radiating to left shoulder

EXAM:
MRI CERVICAL SPINE WITHOUT CONTRAST
TECHNIQUE: Multiplanar, multisequence MR imaging of the cervical spine was
performed. No intravenous contrast was administered.

[Series 3: T2 · sagittal · 3.0mm · 0.66mm/px · 6 of 20 slices shown (1 of 2)]
[im 1/20]
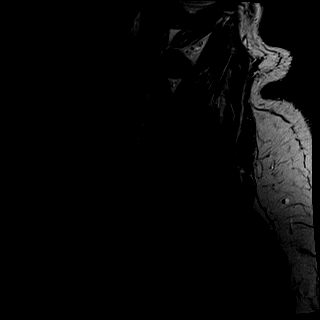
[im 4/20]
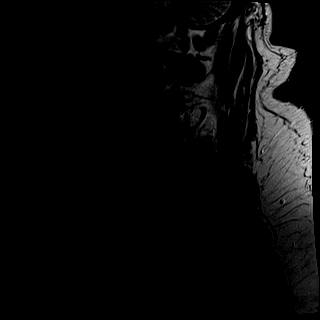
[im 8/20]
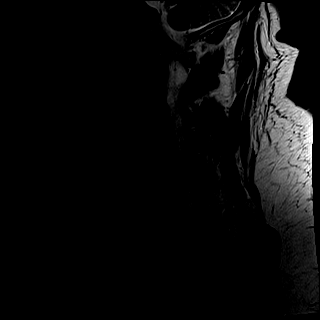
[im 12/20]
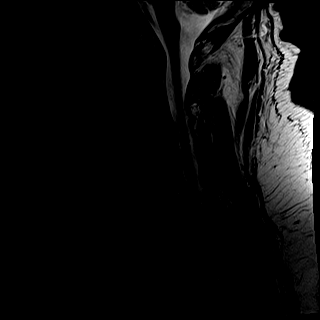
[im 16/20]
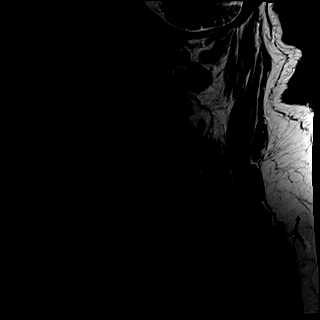
[im 20/20]
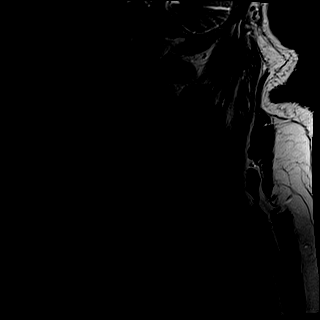

[Series 4: T1 · sagittal · 3.0mm · 0.41mm/px · 6 of 20 slices shown]
[im 1/20]
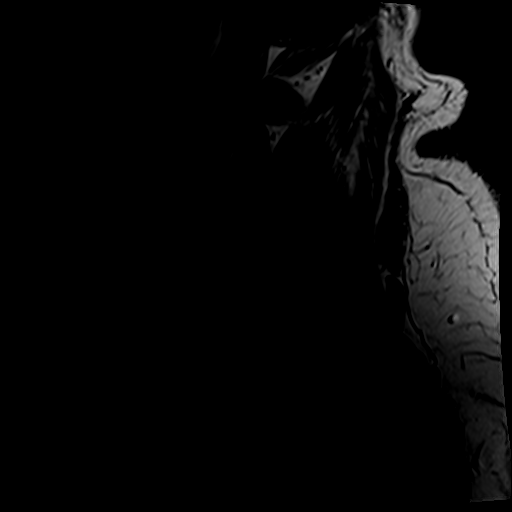
[im 4/20]
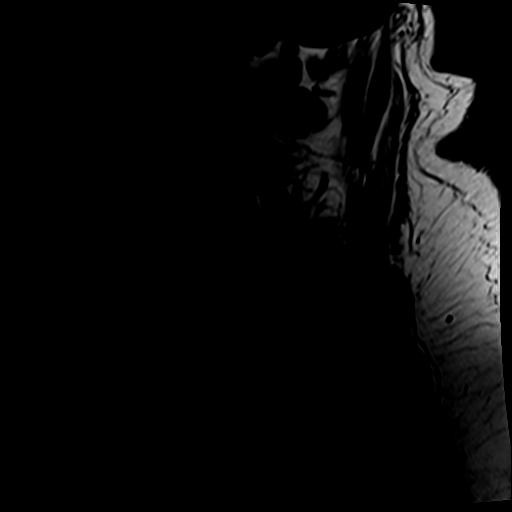
[im 8/20]
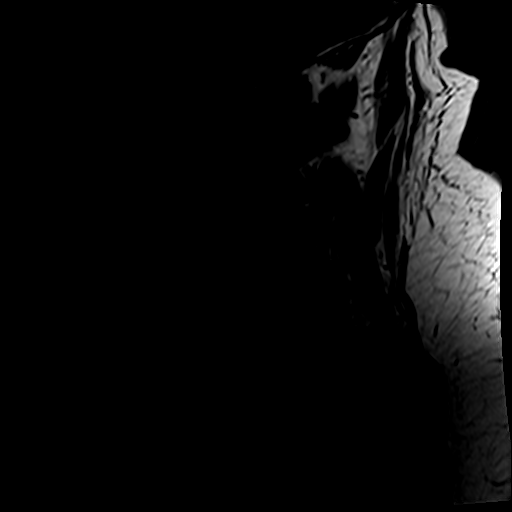
[im 12/20]
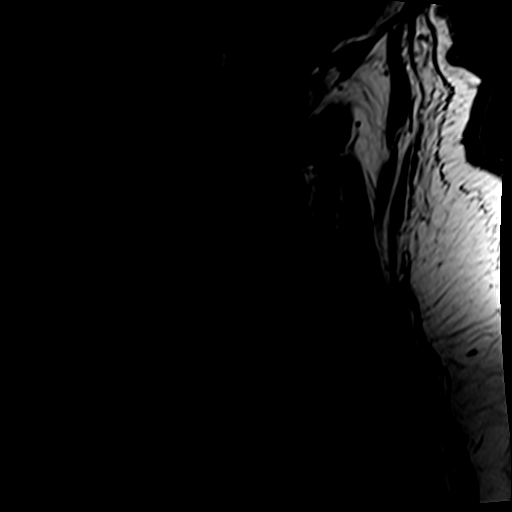
[im 16/20]
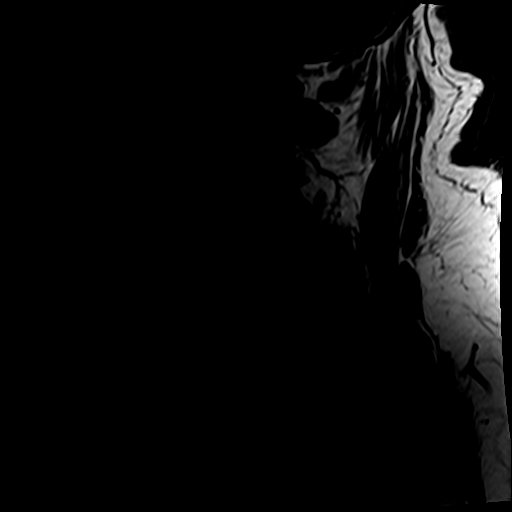
[im 20/20]
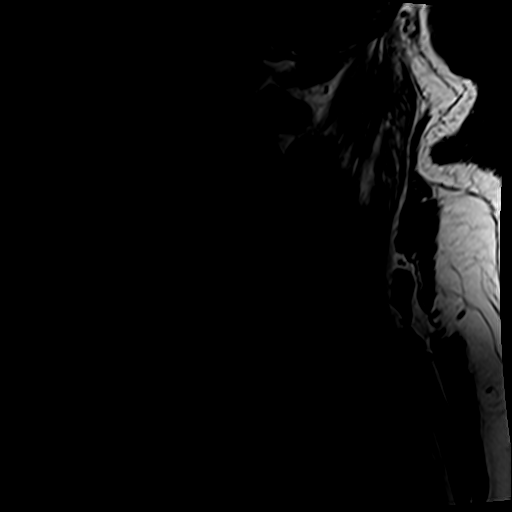

[Series 5: tir sag · sagittal · 3.0mm · 0.41mm/px · 6 of 20 slices shown]
[im 1/20]
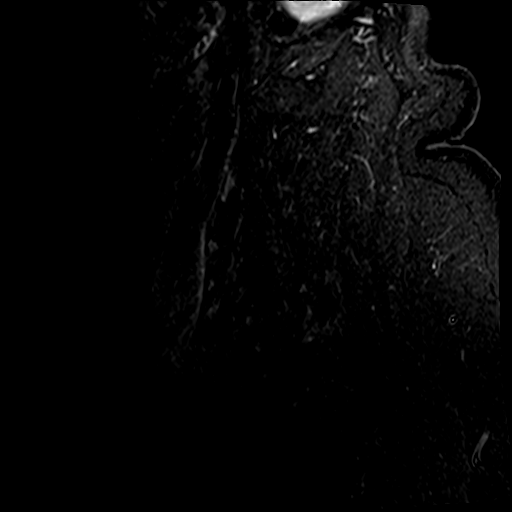
[im 4/20]
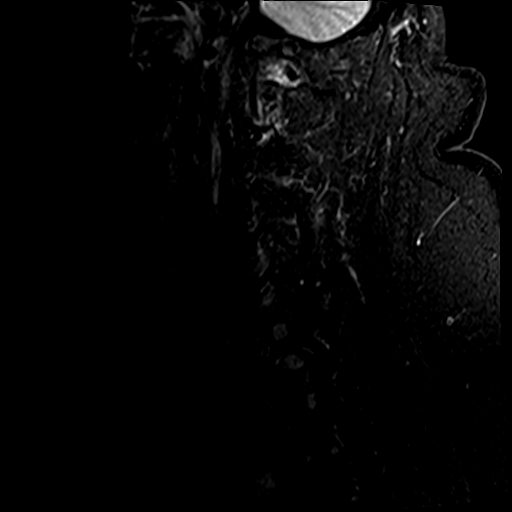
[im 8/20]
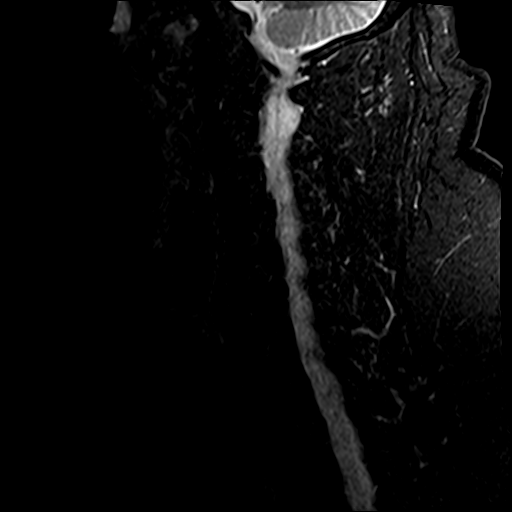
[im 12/20]
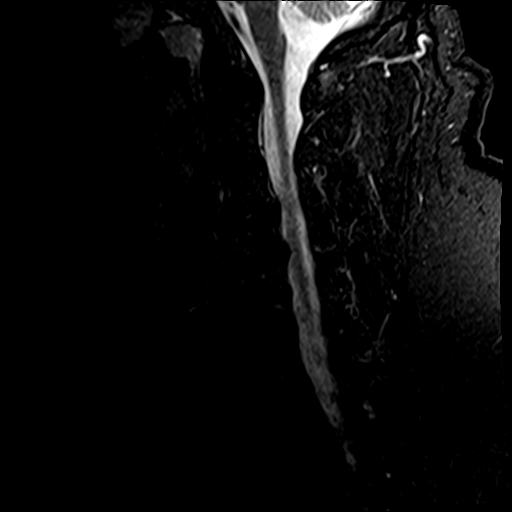
[im 16/20]
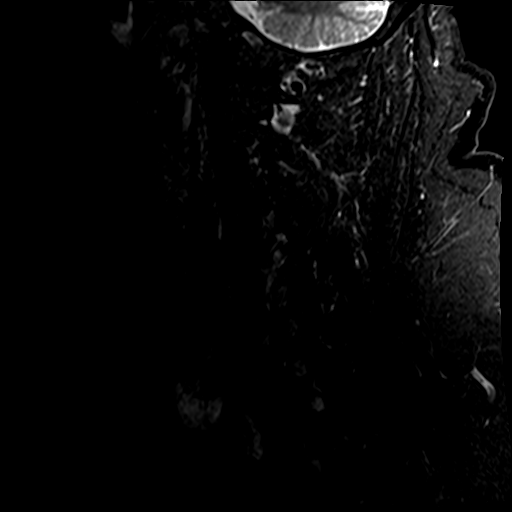
[im 20/20]
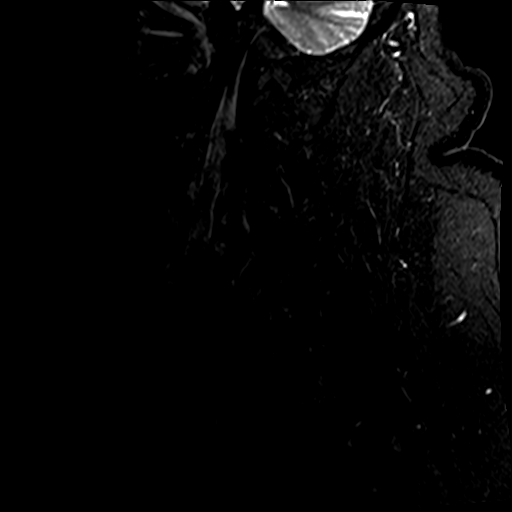

[Series 6: GRE · axial · 3.0mm · 0.35mm/px · 1 of 46 slices shown]
[im 1/46]
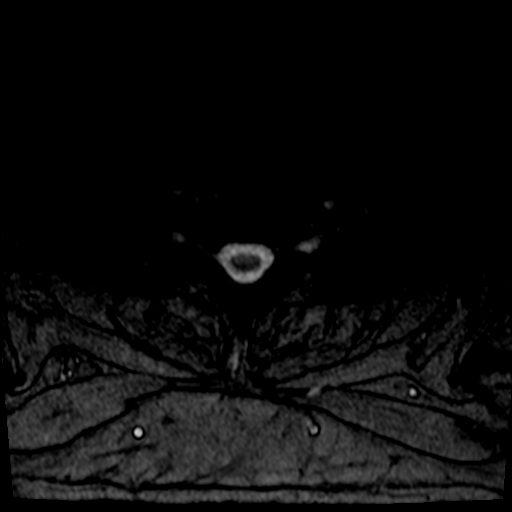

[Series 7: T2 · axial · 3.0mm · 0.70mm/px · z∈[-46,+123]mm · 9 of 46 slices shown (2 of 2)]
[im 1/46]
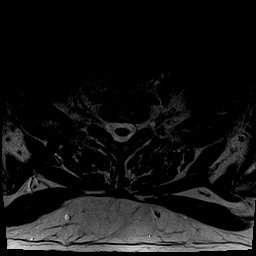
[im 7/46]
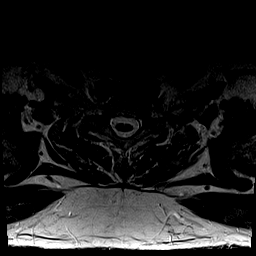
[im 13/46]
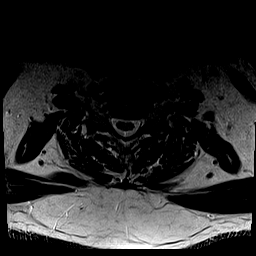
[im 20/46]
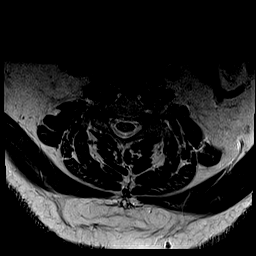
[im 23/46]
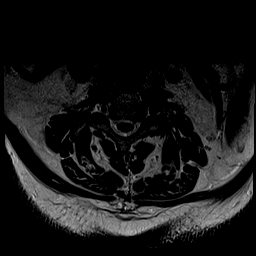
[im 26/46]
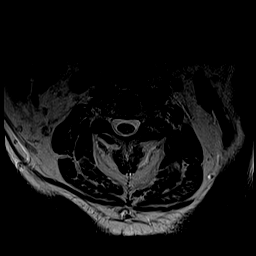
[im 33/46]
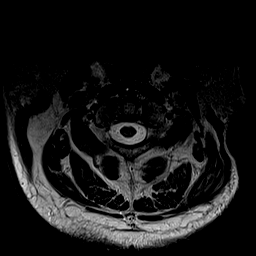
[im 39/46]
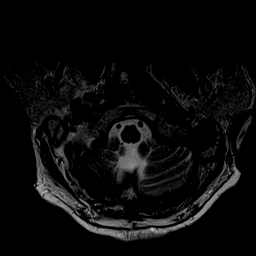
[im 46/46]
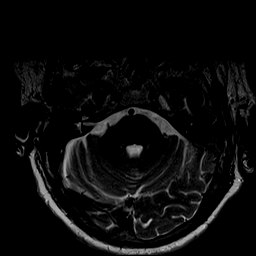

[28 of 48 positions shown; findings below may reference images not displayed]

FINDINGS: Alignment: Straightening and mild reversal of the normal cervical
lordosis. No significant listhesis.

Vertebrae: Status post ACDF C6-C7. No fracture, evidence of
discitis, or suspicious bone lesion.

Cord: Normal signal and morphology.

Posterior Fossa, vertebral arteries, paraspinal tissues: Negative.

Disc levels:

C2-C3: No significant disc bulge. Left-greater-than-right facet
arthropathy. No spinal canal stenosis. Mild left neural foraminal
narrowing.

C3-C4: No significant disc bulge. Left-greater-than-right facet
arthropathy. No spinal canal stenosis. Moderate left and mild right
neural foraminal narrowing.

C4-C5: Mild broad-based disc bulge. Uncovertebral and facet
arthropathy. No spinal canal stenosis. Moderate left and mild right
neural foraminal narrowing.

C5-C6: Mild disc bulge. Uncovertebral and facet arthropathy. No
spinal canal stenosis. Mild bilateral neural foraminal narrowing.

C6-C7: Status post ACDF. Uncovertebral and facet arthropathy. No
spinal canal stenosis. Moderate bilateral neural foraminal
narrowing.

C7-T1: No significant disc bulge. No spinal canal stenosis or
neuroforaminal narrowing.
IMPRESSION: 1. C6-C7 moderate bilateral neural foraminal narrowing.
2. C3-C4 and C4-C5 moderate left and mild right neural foraminal
narrowing.
3. C5-C6 mild bilateral neural foraminal narrowing.

## 2022-08-02 ENCOUNTER — Emergency Department (HOSPITAL_COMMUNITY): Payer: Medicare Other

## 2022-08-02 ENCOUNTER — Inpatient Hospital Stay (HOSPITAL_COMMUNITY)
Admission: EM | Admit: 2022-08-02 | Discharge: 2022-08-04 | DRG: 660 | Disposition: A | Discharge status: Home / Self Care | Payer: Medicare Other | Attending: Internal Medicine | Admitting: Internal Medicine

## 2022-08-02 ENCOUNTER — Encounter (HOSPITAL_COMMUNITY): Payer: Self-pay

## 2022-08-02 DIAGNOSIS — N200 Calculus of kidney: Secondary | ICD-10-CM

## 2022-08-02 DIAGNOSIS — N179 Acute kidney failure, unspecified: Secondary | ICD-10-CM | POA: Diagnosis not present

## 2022-08-02 DIAGNOSIS — N132 Hydronephrosis with renal and ureteral calculous obstruction: Secondary | ICD-10-CM | POA: Diagnosis not present

## 2022-08-02 DIAGNOSIS — Z7985 Long-term (current) use of injectable non-insulin antidiabetic drugs: Secondary | ICD-10-CM

## 2022-08-02 DIAGNOSIS — I2510 Atherosclerotic heart disease of native coronary artery without angina pectoris: Secondary | ICD-10-CM | POA: Diagnosis not present

## 2022-08-02 DIAGNOSIS — G4733 Obstructive sleep apnea (adult) (pediatric): Secondary | ICD-10-CM | POA: Diagnosis present

## 2022-08-02 DIAGNOSIS — E785 Hyperlipidemia, unspecified: Secondary | ICD-10-CM | POA: Diagnosis present

## 2022-08-02 DIAGNOSIS — Z79899 Other long term (current) drug therapy: Secondary | ICD-10-CM

## 2022-08-02 DIAGNOSIS — E11649 Type 2 diabetes mellitus with hypoglycemia without coma: Secondary | ICD-10-CM | POA: Diagnosis not present

## 2022-08-02 DIAGNOSIS — E1169 Type 2 diabetes mellitus with other specified complication: Secondary | ICD-10-CM

## 2022-08-02 DIAGNOSIS — Z6841 Body Mass Index (BMI) 40.0 and over, adult: Secondary | ICD-10-CM

## 2022-08-02 DIAGNOSIS — Z955 Presence of coronary angioplasty implant and graft: Secondary | ICD-10-CM

## 2022-08-02 DIAGNOSIS — I252 Old myocardial infarction: Secondary | ICD-10-CM

## 2022-08-02 DIAGNOSIS — Z7982 Long term (current) use of aspirin: Secondary | ICD-10-CM

## 2022-08-02 DIAGNOSIS — Z87891 Personal history of nicotine dependence: Secondary | ICD-10-CM

## 2022-08-02 DIAGNOSIS — R109 Unspecified abdominal pain: Secondary | ICD-10-CM

## 2022-08-02 DIAGNOSIS — Z888 Allergy status to other drugs, medicaments and biological substances status: Secondary | ICD-10-CM

## 2022-08-02 DIAGNOSIS — F32A Depression, unspecified: Secondary | ICD-10-CM | POA: Diagnosis present

## 2022-08-02 DIAGNOSIS — M1990 Unspecified osteoarthritis, unspecified site: Secondary | ICD-10-CM | POA: Diagnosis present

## 2022-08-02 DIAGNOSIS — E119 Type 2 diabetes mellitus without complications: Secondary | ICD-10-CM

## 2022-08-02 DIAGNOSIS — F419 Anxiety disorder, unspecified: Secondary | ICD-10-CM | POA: Diagnosis present

## 2022-08-02 DIAGNOSIS — G8929 Other chronic pain: Secondary | ICD-10-CM | POA: Diagnosis present

## 2022-08-02 DIAGNOSIS — Z794 Long term (current) use of insulin: Secondary | ICD-10-CM

## 2022-08-02 DIAGNOSIS — K219 Gastro-esophageal reflux disease without esophagitis: Secondary | ICD-10-CM | POA: Diagnosis present

## 2022-08-02 DIAGNOSIS — E669 Obesity, unspecified: Secondary | ICD-10-CM | POA: Insufficient documentation

## 2022-08-02 DIAGNOSIS — Z7984 Long term (current) use of oral hypoglycemic drugs: Secondary | ICD-10-CM

## 2022-08-02 DIAGNOSIS — I10 Essential (primary) hypertension: Secondary | ICD-10-CM | POA: Diagnosis present

## 2022-08-02 DIAGNOSIS — E039 Hypothyroidism, unspecified: Secondary | ICD-10-CM | POA: Diagnosis present

## 2022-08-02 DIAGNOSIS — Z79890 Hormone replacement therapy (postmenopausal): Secondary | ICD-10-CM

## 2022-08-02 DIAGNOSIS — Z981 Arthrodesis status: Secondary | ICD-10-CM

## 2022-08-02 DIAGNOSIS — Z7902 Long term (current) use of antithrombotics/antiplatelets: Secondary | ICD-10-CM

## 2022-08-02 HISTORY — DX: Obstructive sleep apnea (adult) (pediatric): G47.33

## 2022-08-02 HISTORY — DX: Type 2 diabetes mellitus without complications: E11.9

## 2022-08-02 LAB — CBG MONITORING, ED
Glucose-Capillary: 77 mg/dL (ref 70–99)
Glucose-Capillary: 118 mg/dL — ABNORMAL HIGH (ref 70–99)

## 2022-08-02 LAB — COMPREHENSIVE METABOLIC PANEL
ALT: 15 U/L (ref 0–44)
AST: 23 U/L (ref 15–41)
Albumin: 3.4 g/dL — ABNORMAL LOW (ref 3.5–5.0)
Alkaline Phosphatase: 31 U/L — ABNORMAL LOW (ref 38–126)
Anion gap: 10 (ref 5–15)
BUN: 23 mg/dL (ref 8–23)
CO2: 21 mmol/L — ABNORMAL LOW (ref 22–32)
Calcium: 9 mg/dL (ref 8.9–10.3)
Chloride: 105 mmol/L (ref 98–111)
Creatinine, Ser: 1.47 mg/dL — ABNORMAL HIGH (ref 0.61–1.24)
GFR, Estimated: 51 mL/min — ABNORMAL LOW (ref >60)
Glucose, Bld: 82 mg/dL (ref 70–99)
Potassium: 4.2 mmol/L (ref 3.5–5.1)
Sodium: 136 mmol/L (ref 135–145)
Total Bilirubin: 0.7 mg/dL (ref 0.3–1.2)
Total Protein: 6.4 g/dL — ABNORMAL LOW (ref 6.5–8.1)

## 2022-08-02 LAB — URINALYSIS, ROUTINE W REFLEX MICROSCOPIC
Bacteria, UA: NONE SEEN
Bilirubin Urine: NEGATIVE
Glucose, UA: >=500 mg/dL — AB
Hgb urine dipstick: NEGATIVE
Ketones, ur: NEGATIVE mg/dL
Leukocytes,Ua: NEGATIVE
Nitrite: NEGATIVE
Protein, ur: NEGATIVE mg/dL
Specific Gravity, Urine: 1.013 (ref 1.005–1.030)
pH: 6 (ref 5.0–8.0)

## 2022-08-02 LAB — CBC
HCT: 39.3 % (ref 39.0–52.0)
Hemoglobin: 13.1 g/dL (ref 13.0–17.0)
MCH: 31.1 pg (ref 26.0–34.0)
MCHC: 33.3 g/dL (ref 30.0–36.0)
MCV: 93.3 fL (ref 80.0–100.0)
Platelets: 195 10*3/uL (ref 150–400)
RBC: 4.21 MIL/uL — ABNORMAL LOW (ref 4.22–5.81)
RDW: 14.6 % (ref 11.5–15.5)
WBC: 8.9 10*3/uL (ref 4.0–10.5)
nRBC: 0 % (ref 0.0–0.2)

## 2022-08-02 LAB — LIPASE, BLOOD: Lipase: 36 U/L (ref 11–51)

## 2022-08-02 MED ORDER — METOCLOPRAMIDE HCL 5 MG/ML IJ SOLN
5.0000 mg | Freq: Once | INTRAMUSCULAR | Status: AC
  Administered 2022-08-02: 5 mg via INTRAVENOUS
  Filled 2022-08-02: qty 2

## 2022-08-02 MED ORDER — LACTATED RINGERS IV SOLN: INTRAVENOUS | Status: AC

## 2022-08-02 MED ORDER — SODIUM CHLORIDE 0.9 % IV BOLUS
1000.0000 mL | Freq: Once | INTRAVENOUS | Status: AC
  Administered 2022-08-02: 1000 mL via INTRAVENOUS

## 2022-08-02 MED ORDER — IOHEXOL 350 MG/ML SOLN
75.0000 mL | Freq: Once | INTRAVENOUS | Status: AC | PRN
  Administered 2022-08-02: 75 mL via INTRAVENOUS

## 2022-08-02 MED ORDER — HYDROMORPHONE HCL 1 MG/ML IJ SOLN
1.0000 mg | Freq: Once | INTRAMUSCULAR | Status: AC
  Administered 2022-08-02: 1 mg via INTRAVENOUS
  Filled 2022-08-02: qty 1

## 2022-08-02 MED ORDER — MORPHINE SULFATE (PF) 4 MG/ML IV SOLN
4.0000 mg | Freq: Once | INTRAVENOUS | Status: AC
  Administered 2022-08-02: 4 mg via INTRAVENOUS
  Filled 2022-08-02: qty 1

## 2022-08-02 MED ORDER — HYDROMORPHONE HCL 1 MG/ML IJ SOLN
1.0000 mg | INTRAMUSCULAR | Status: DC | PRN
  Administered 2022-08-03 (×6): 1 mg via INTRAVENOUS
  Filled 2022-08-02 (×7): qty 1

## 2022-08-02 MED ORDER — FENTANYL CITRATE PF 50 MCG/ML IJ SOSY
50.0000 ug | PREFILLED_SYRINGE | Freq: Once | INTRAMUSCULAR | Status: AC
  Administered 2022-08-02: 50 ug via INTRAVENOUS
  Filled 2022-08-02: qty 1

## 2022-08-02 MED ORDER — ONDANSETRON 4 MG PO TBDP
4.0000 mg | ORAL_TABLET | ORAL | Status: AC
  Administered 2022-08-02: 4 mg via ORAL
  Filled 2022-08-02: qty 1

## 2022-08-02 MED ORDER — ONDANSETRON HCL 4 MG/2ML IJ SOLN
4.0000 mg | Freq: Once | INTRAMUSCULAR | Status: AC
  Administered 2022-08-02: 4 mg via INTRAVENOUS
  Filled 2022-08-02: qty 2

## 2022-08-02 NOTE — Assessment & Plan Note (Addendum)
Observation med/surg bed. Continue with IV opiates for pain control. Urology consulted by EDP. UA without evidence of infection. Will hold on abx for now. If he spikes a fever, send urine/blood cx and start IV rocephin.

## 2022-08-02 NOTE — Assessment & Plan Note (Signed)
Continue with Ivf.

## 2022-08-02 NOTE — H&P (Signed)
History and Physical    Douglas Aguirre WUJ:[id number] DOB: 1950/07/21 DOA: 08/02/2022  DOS: the patient was seen and examined on 08/02/2022  PCP: Moody Bruins, FNP   Patient coming from: Home  I have personally briefly reviewed patient's old medical records in [facility]  CC: right sided abd pain HPI: 72 year old white male history of hypertension, type 2 diabetes, coronary disease status post drug-eluting stent 2023,, morbid obesity BMI 39, OSA on CPAP, prior history of kidney stones presents the ER today with a 2-day history of abdominal pain/right lower quadrant pain.  Patient states that he has had chronic back pain for several years.  Thought that this was an exacerbation of his back pain.  Patient was bring his wife down for medical office appointment today when he noted that the pain started to increase.  Patient was dropped off at the ER.  Patient noted that he started urinating blood yesterday.  This was intermittent.  Denies any fever or chills.  Had some nausea but no vomiting.  On Arrival temp 97.6 heart rate 61 blood pressure 130/69.  White count 8.9, hemoglobin 13.1, platelets 195  UA showed greater than 500 glucose negative nitrate negative leukocyte Estrace  Sodium 136, potassium 4.2, chloride 105, bicarb 21, BUN 23, creatinine 0.47  Lipase 36  CT abdomen pelvis demonstrated right moderate hydronephrosis and proximal hydroureter secondary to a 10 x 9 mm stone in the right proximal ureter about 2.5 cm from the UPJ.  There is an additional 2 mm stone slightly superior to the larger stone.  There is right perinephric stranding.  Patient required multiple doses of IV narcotics.  [facility] hospitalist contacted for admission.   ED Course: CT abd/pelvis shows 1 cm obstructing right kidney stone with hydronephrosis and hydroureter  Review of Systems:  Review of Systems  Constitutional:  Negative for chills and fever.  HENT: Negative.    Eyes: Negative.    Respiratory: Negative.    Cardiovascular: Negative.   Gastrointestinal:  Positive for abdominal pain and nausea. Negative for vomiting.  Genitourinary:  Positive for hematuria.  Musculoskeletal:  Positive for back pain.  Skin: Negative.   Neurological: Negative.   Endo/Heme/Allergies: Negative.   Psychiatric/Behavioral: Negative.    All other systems reviewed and are negative.   Past Medical History:  Diagnosis Date   Anxiety    Arthritis    degenerative joints, back- upper & lower    Coronary artery disease    Depression    Diabetes mellitus without complication (HCC)    Diabetes mellitus, type 2 (HCC)    GERD (gastroesophageal reflux disease)    Herniated cervical disc 06/21/2016   History of hiatal hernia    History of kidney stones    passed spontaneously, & has cystoscopy & lithotripsy   Hypertension    Hypothyroidism    Myocardial infarction [id number])    Neuromuscular disorder (HCC)    carpal tunnel syndrome - both hands    OSA on CPAP    Sleep apnea 2013   last study- cpap    Past Surgical History:  Procedure Laterality Date   ANTERIOR CERVICAL DECOMP/DISCECTOMY FUSION N/A 06/21/2016   Procedure: Cervical Six-Seven Anterior cervical decompression/discectomy/fusion; LEFT CARPAL TUNNEL RELEASE;  Surgeon: Maeola Harman, MD;  Location: [facility] OR;  Service: Neurosurgery;  Laterality: N/A;  left side approach   CARPAL TUNNEL RELEASE Right 2017   CARPAL TUNNEL RELEASE Left 06/21/2016   Procedure: LEFT CARPAL TUNNEL RELEASE;  Surgeon: Maeola Harman, MD;  Location:  [facility] OR;  Service: Neurosurgery;  Laterality: Left;  LEFT    CARPAL TUNNEL RELEASE Right 08/19/2016   Procedure: RIGHT CARPAL TUNNEL RELEASE;  Surgeon: Maeola Harman, MD;  Location[facility] OR;  Service: Neurosurgery;  Laterality: Right;  RIGHT CARPAL TUNNEL RELEASE   CORONARY ANGIOPLASTY     Taxus dRCA 04/2006, Xience DES mLAD and dRCA 09/2006 ([place])   CORONARY ANGIOPLASTY WITH STENT PLACEMENT     EYE SURGERY  Bilateral    cataracts removed- by laser    NASAL SINUS SURGERY     turbinate reduction, palate surgery at the same time.    SHOULDER ARTHROSCOPY Left 2017   for rotator cuff x2   THYROIDECTOMY, PARTIAL  2005     reports that he quit smoking about 11 years ago. He has never used smokeless tobacco. He reports that he does not drink alcohol and does not use drugs.  Allergies  Allergen Reactions   Alpha-Gal Other (See Comments)   Niacin Other (See Comments)    Redness and burning    History reviewed. No pertinent family history.  Prior to Admission medications   Medication Sig Start Date End Date Taking? Authorizing Provider  aspirin EC 81 MG tablet Take 81 mg by mouth daily.   Yes [provider]  clopidogrel (PLAVIX) 75 MG tablet Take 75 mg by mouth at bedtime.   Yes [provider]  levothyroxine (SYNTHROID, LEVOTHROID) 50 MCG tablet Take 50 mcg by mouth daily before breakfast. 05/20/16  Yes [provider]  Multiple Vitamin (MULTIVITAMIN WITH MINERALS) TABS tablet Take 1 tablet by mouth daily with breakfast.   Yes [provider]  omeprazole (PRILOSEC) 40 MG capsule Take 40 mg by mouth daily before breakfast.   Yes [provider]  pioglitazone (ACTOS) 30 MG tablet Take 30 mg by mouth in the morning.   Yes [provider]  ramipril (ALTACE) 10 MG capsule Take 10 mg by mouth in the morning.   Yes [provider]  sertraline (ZOLOFT) 50 MG tablet Take 50 mg by mouth in the morning.   Yes [provider]  tamsulosin (FLOMAX) 0.4 MG CAPS capsule Take 0.4 mg by mouth in the morning.   Yes [provider]  Aspirin-Acetaminophen-Caffeine (GOODYS EXTRA STRENGTH) [phone] MG PACK Take 1 packet by mouth daily as needed (for headaches.).    [provider]  carvedilol (COREG) 6.25 MG tablet Take 6.25 mg by mouth 2 (two) times daily.    [provider]  fenofibrate micronized (LOFIBRA) 134 MG  capsule Take 134 mg by mouth at bedtime.    [provider]  glimepiride (AMARYL) 4 MG tablet Take 4 mg by mouth 2 (two) times daily.    [provider]  HYDROcodone-acetaminophen (NORCO) 10-325 MG tablet Take 1-2 tablets by mouth every 4 (four) hours as needed for severe pain. Patient taking differently: Take 1 tablet by mouth 5 (five) times daily.  06/22/16   Maeola Harman, MD  Insulin Detemir (LEVEMIR FLEXPEN) 100 UNIT/ML Pen Inject 100 Units into the skin every morning.     [provider]  nitroGLYCERIN (NITROSTAT) 0.4 MG SL tablet Place 0.4 mg under the tongue every 5 (five) minutes as needed for chest pain.    [provider]  PRESCRIPTION MEDICATION Take 10 mg by mouth every other day. DOMPERIDONE 10 MG     [provider]  simvastatin (ZOCOR) 40 MG tablet Take 40 mg by mouth at bedtime.    [provider]  sitaGLIPtin-metformin (JANUMET) 50-1000 MG tablet Take 1 tablet by mouth 2 (two) times daily with a meal.    [provider]  traZODone (DESYREL) 100 MG tablet Take 100 mg by mouth at bedtime.    [provider]  VICTOZA 18 MG/3ML SOPN Inject 0.6 mg into the skin at bedtime. 03/17/16   [provider]    Physical Exam: Vitals:   08/02/22 1357 08/02/22 1402 08/02/22 1731 08/02/22 1900  BP: 130/69  (!) 115/55 138/68  Pulse: 61  62 (!) 56  Resp: 17  19 18   Temp: 97.6 F (36.4 C)  97.7 F (36.5 C) 97.6 F (36.4 C)  TempSrc: Oral  Oral Oral  SpO2: 93%  98% 95%  Weight:  120.2 kg    Height:  5\' 9"  (1.753 m)      Physical Exam Vitals and nursing note reviewed.  Constitutional:      General: He is not in acute distress.    Appearance: He is obese. He is not ill-appearing, toxic-appearing or diaphoretic.  HENT:     Head: Normocephalic and atraumatic.     Nose: Nose normal.  Eyes:     General: No scleral icterus. Cardiovascular:     Rate and Rhythm: Normal rate and regular rhythm.  Pulmonary:      Effort: Pulmonary effort is normal.     Breath sounds: Normal breath sounds.  Abdominal:     General: Bowel sounds are normal.     Palpations: Abdomen is soft.     Tenderness: There is abdominal tenderness.  Musculoskeletal:     Right lower leg: No edema.     Left lower leg: No edema.  Skin:    General: Skin is warm and dry.     Capillary Refill: Capillary refill takes less than 2 seconds.  Neurological:     General: No focal deficit present.     Mental Status: He is alert and oriented to person, place, and time.      Labs on Admission: I have personally reviewed following labs and imaging studies  CBC: Recent Labs  Lab 08/02/22 1418  WBC 8.9  HGB 13.1  HCT 39.3  MCV 93.3  PLT 195   Basic Metabolic Panel: Recent Labs  Lab 08/02/22 1418  NA 136  K 4.2  CL 105  CO2 21*  GLUCOSE 82  BUN 23  CREATININE 1.47*  CALCIUM 9.0   GFR: Estimated Creatinine Clearance: 60.7 mL/min (A) (by C-G formula based on SCr of 1.47 mg/dL (H)). Liver Function Tests: Recent Labs  Lab 08/02/22 1418  AST 23  ALT 15  ALKPHOS 31*  BILITOT 0.7  PROT 6.4*  ALBUMIN 3.4*   Recent Labs  Lab 08/02/22 1418  LIPASE 36   CBG: Recent Labs  Lab 08/02/22 2115  GLUCAP 77   Urine analysis:    Component Value Date/Time   COLORURINE YELLOW 08/02/2022 1404   APPEARANCEUR CLEAR 08/02/2022 1404   LABSPEC 1.013 08/02/2022 1404   PHURINE 6.0 08/02/2022 1404   GLUCOSEU >=500 (A) 08/02/2022 1404   HGBUR NEGATIVE 08/02/2022 1404   BILIRUBINUR NEGATIVE 08/02/2022 1404   KETONESUR NEGATIVE 08/02/2022 1404   PROTEINUR NEGATIVE 08/02/2022 1404   NITRITE NEGATIVE 08/02/2022 1404   LEUKOCYTESUR NEGATIVE 08/02/2022 1404    Radiological Exams on Admission: I have personally reviewed images CT ABDOMEN PELVIS W CONTRAST  Result Date: 08/02/2022 CLINICAL DATA:  Abdomen flank pain EXAM: CT ABDOMEN AND PELVIS WITH CONTRAST TECHNIQUE: Multidetector CT imaging of the abdomen  and pelvis was performed  using the standard protocol following bolus administration of intravenous contrast. RADIATION DOSE REDUCTION: This exam was performed according to the departmental dose-optimization program which includes automated exposure control, adjustment of the mA and/or kV according to patient size and/or use of iterative reconstruction technique. CONTRAST:  75mL OMNIPAQUE IOHEXOL 350 MG/ML SOLN COMPARISON:  None Available. FINDINGS: Lower chest: Lung bases demonstrate no acute airspace disease. Cardiomegaly. Coronary vascular calcification. Hepatobiliary: No focal liver abnormality is seen. No gallstones, gallbladder wall thickening, or biliary dilatation. Pancreas: Unremarkable. No pancreatic ductal dilatation or surrounding inflammatory changes. Spleen: Normal in size without focal abnormality. Adrenals/Urinary Tract: Adrenal glands are normal. Mild to moderate right hydronephrosis and proximal hydroureter, secondary to a 10 x 9 mm stone in the proximal right ureter about 2.5 cm distal to the right UPJ. There is an additional punctate 2 mm stone slightly cranial to the larger stone. Moderate right perinephric fat stranding. Delayed excretion of contrast from right kidney consistent with obstruction. Bladder is unremarkable. Stomach/Bowel: Stomach is within normal limits. Appendix appears normal. No evidence of bowel wall thickening, distention, or inflammatory changes. Vascular/Lymphatic: Moderate aortic atherosclerosis. No aneurysm. No suspicious lymph nodes Reproductive: Enlarged prostate Other: Negative for pelvic effusion or free air. Small fat containing inguinal hernias. Negative for abdominal wall hernia other than small fat containing umbilical hernia Musculoskeletal: No acute osseous abnormality. IMPRESSION: 1. Mild to moderate right hydronephrosis and proximal hydroureter secondary to a 10 x 9 mm stone in the proximal right ureter about 2.5 cm distal to the right UPJ. There is an additional punctate 2 mm stone  slightly cranial to the larger stone. Moderate right perinephric fat stranding. 2. Enlarged prostate. 3. Aortic atherosclerosis. Aortic Atherosclerosis (ICD10-I70.0). Electronically Signed   By: Jasmine Pang M.D.   On: 08/02/2022 18:49    EKG: My personal interpretation of EKG shows: no EKG  Assessment/Plan Principal Problem:   Hydronephrosis with obstructing calculus Active Problems:   AKI (acute kidney injury) (HCC)   Hypertension   OSA on CPAP   Diabetes mellitus, type 2 (HCC)   Coronary artery disease   Obesity (BMI 30-39.9)    Assessment and Plan: * Hydronephrosis with obstructing calculus Observation med/surg bed. Continue with IV opiates for pain control. Urology consulted by EDP. UA without evidence of infection. Will hold on abx for now. If he spikes a fever, send urine/blood cx and start IV rocephin.  AKI (acute kidney injury) (HCC) Continue with Ivf.  Coronary artery disease Continue statin and fenofibrate, coreg. Hold plavix and asa.  Diabetes mellitus, type 2 (HCC) Keep npo. Start SSI.  OSA on CPAP Continue with cpap  Hypertension Stable. Hold ACEI due to AKI. Continue with coreg.  Obesity (BMI 30-39.9) BMI 39.1   DVT prophylaxis: SCDs Code Status: Full Code Family Communication:  no family at bedside  Disposition Plan: return home  Consults called: EDP has consulted Dr. Berneice Heinrich with urology  Admission status: Observation, Med-Surg   Carollee Herter, DO [facility] 08/02/2022, 10:14 PM

## 2022-08-02 NOTE — ED Provider Triage Note (Addendum)
Emergency Medicine Provider Triage Evaluation Note  Douglas Aguirre , a 72 y.o. male  was evaluated in triage.  Pt complains of RLQ pain since last night/early this morning.  He reports some nausea but no vomiting.  Denies any diarrhea or constipation.  No melena or hematochezia.  He reports he did have some blood in his urine last night with some pain but does not feel he is having any urinary urgency or frequency.  Does have a history of kidney stones reports this does not feel similar.  He noted some swelling to the right side of his abdomen as well. Denies any fevers or chills  Review of Systems  Positive:  Negative:   Physical Exam  BP 130/69 (BP Location: Right Arm)   Pulse 61   Temp 97.6 F (36.4 C) (Oral)   Resp 17   Ht 5\' 9"  (1.753 m)   Wt 120.2 kg   SpO2 93%   BMI 39.13 kg/m  Gen:   Awake, no distress   Resp:  Normal effort  MSK:   Moves extremities without difficulty  Other:  The patient does have some swelling noted to the right side of his abdomen. Non pulsatile.  There is a very small overlying bruise on her patient reports that this is where he changes his spots for injections.  Does have some tenderness to palpation of the right periumbilical/right lower quadrant area.  No rebound tenderness.  Does have some abdominal distention.  Medical Decision Making  Medically screening exam initiated at 2:12 PM.  Appropriate orders placed.  [name] was informed that the remainder of the evaluation will be completed by another provider, this initial triage assessment does not replace that evaluation, and the importance of remaining in the ED until their evaluation is complete.  Question hernia, but odd with hematuria. Will order CT with contrast.    Achille Rich, PA-C 08/02/22 1417    Achille Rich, [place] 08/02/22 [phone]

## 2022-08-02 NOTE — ED Notes (Signed)
ED TO INPATIENT HANDOFF REPORT  ED Nurse Name and Phone #: Lanora Manis [phone]  S Name/Age/Gender Douglas Aguirre 72 y.o. male Room/Bed: H011C/H011C  Code Status   Code Status: Full Code  Home/SNF/Other Home Patient oriented to: self, place, time, and situation Is this baseline? Yes   Triage Complete: Triage complete  Chief Complaint Hydronephrosis with obstructing calculus [N13.2]  Triage Note Pt c/o RLQ pain/swelling and hematuria since last night.  Pain score 8/10.  Pt reports taking prescribed pain medication w/o relief.  Hx of kidney stones.  RLQ noted to be swollen, but no pain w/ palpation and area is still soft.    Allergies Allergies  Allergen Reactions   Alpha-Gal Other (See Comments)   Niacin Other (See Comments)    Redness and burning    Level of Care/Admitting Diagnosis ED Disposition     ED Disposition  Admit   Condition  --   Comment  Hospital Area: [facility] [[id number]]  Level of Care: Med-Surg [16]  May place patient in observation at [facility] or [facility] if equivalent level of care is available:: No  Covid Evaluation: Asymptomatic - no recent exposure (last 10 days) testing not required  Diagnosis: Hydronephrosis with obstructing calculus [[id number]]  Admitting Physician: Imogene Burn, ERIC [[id number]]  Attending Physician: Imogene Burn, ERIC [[id number]]          B Medical/Surgery History Past Medical History:  Diagnosis Date   Anxiety    Arthritis    degenerative joints, back- upper & lower    Coronary artery disease    Depression    Diabetes mellitus without complication (HCC)    Diabetes mellitus, type 2 (HCC)    GERD (gastroesophageal reflux disease)    Herniated cervical disc 06/21/2016   History of hiatal hernia    History of kidney stones    passed spontaneously, & has cystoscopy & lithotripsy   Hypertension    Hypothyroidism    Myocardial infarction [facility])    Neuromuscular disorder (HCC)    carpal tunnel syndrome - both hands     OSA on CPAP    Sleep apnea 2013   last study- cpap   Past Surgical History:  Procedure Laterality Date   ANTERIOR CERVICAL DECOMP/DISCECTOMY FUSION N/A 06/21/2016   Procedure: Cervical Six-Seven Anterior cervical decompression/discectomy/fusion; LEFT CARPAL TUNNEL RELEASE;  Surgeon: Maeola Harman, MD;  Location: [facility] OR;  Service: Neurosurgery;  Laterality: N/A;  left side approach   CARPAL TUNNEL RELEASE Right 2017   CARPAL TUNNEL RELEASE Left 06/21/2016   Procedure: LEFT CARPAL TUNNEL RELEASE;  Surgeon: Maeola Harman, MD;  Location: [facility] OR;  Service: Neurosurgery;  Laterality: Left;  LEFT    CARPAL TUNNEL RELEASE Right 08/19/2016   Procedure: RIGHT CARPAL TUNNEL RELEASE;  Surgeon: Maeola Harman, MD;  Location: [facility] OR;  Service: Neurosurgery;  Laterality: Right;  RIGHT CARPAL TUNNEL RELEASE   CORONARY ANGIOPLASTY     Taxus dRCA 04/2006, Xience DES mLAD and dRCA 09/2006 ([place])   CORONARY ANGIOPLASTY WITH STENT PLACEMENT     EYE SURGERY Bilateral    cataracts removed- by laser    NASAL SINUS SURGERY     turbinate reduction, palate surgery at the same time.    SHOULDER ARTHROSCOPY Left 2017   for rotator cuff x2   THYROIDECTOMY, PARTIAL  2005     A IV Location/Drains/Wounds Patient Lines/Drains/Airways Status     Active Line/Drains/Airways     Name Placement date Placement time Site Days   Peripheral IV 08/02/22 20  G Right Antecubital 08/02/22  1744  Antecubital  less than 1   Incision (Closed) 06/21/16 Neck Other (Comment) 06/21/16  1351  -- 2233   Incision (Closed) 06/21/16 Wrist Left 06/21/16  1351  -- 2233   Incision (Closed) 08/19/16 Hand Right 08/19/16  0800  -- 2174            Intake/Output Last 24 hours No intake or output data in the 24 hours ending 08/02/22 2233  Labs/Imaging Results for orders placed or performed during the hospital encounter of 08/02/22 (from the past 48 hour(s))  Urinalysis, Routine w reflex microscopic -Urine, Clean Catch     Status:  Abnormal   Collection Time: 08/02/22  2:04 PM  Result Value Ref Range   Color, Urine YELLOW YELLOW   APPearance CLEAR CLEAR   Specific Gravity, Urine 1.013 1.005 - 1.030   pH 6.0 5.0 - 8.0   Glucose, UA >=500 (A) NEGATIVE mg/dL   Hgb urine dipstick NEGATIVE NEGATIVE   Bilirubin Urine NEGATIVE NEGATIVE   Ketones, ur NEGATIVE NEGATIVE mg/dL   Protein, ur NEGATIVE NEGATIVE mg/dL   Nitrite NEGATIVE NEGATIVE   Leukocytes,Ua NEGATIVE NEGATIVE   RBC / HPF 6-10 0 - 5 RBC/hpf   WBC, UA 0-5 0 - 5 WBC/hpf   Bacteria, UA NONE SEEN NONE SEEN   Squamous Epithelial / HPF 0-5 0 - 5 /HPF   Mucus PRESENT     Comment: Performed at [facility], [address], [place], [place] [address]  Lipase, blood     Status: None   Collection Time: 08/02/22  2:18 PM  Result Value Ref Range   Lipase 36 11 - 51 U/L    Comment: Performed at [facility], [address] [place], [place] [address]  Comprehensive metabolic panel     Status: Abnormal   Collection Time: 08/02/22  2:18 PM  Result Value Ref Range   Sodium 136 135 - 145 mmol/L   Potassium 4.2 3.5 - 5.1 mmol/L   Chloride 105 98 - 111 mmol/L   CO2 21 (L) 22 - 32 mmol/L   Glucose, Bld 82 70 - 99 mg/dL    Comment: Glucose reference range applies only to samples taken after fasting for at least 8 hours.   BUN 23 8 - 23 mg/dL   Creatinine, Ser 5.18 (H) 0.61 - 1.24 mg/dL   Calcium 9.0 8.9 - 84.1 mg/dL   Total Protein 6.4 (L) 6.5 - 8.1 g/dL   Albumin 3.4 (L) 3.5 - 5.0 g/dL   AST 23 15 - 41 U/L   ALT 15 0 - 44 U/L   Alkaline Phosphatase 31 (L) 38 - 126 U/L   Total Bilirubin 0.7 0.3 - 1.2 mg/dL   GFR, Estimated 51 (L) >60 mL/min    Comment: (NOTE) Calculated using the CKD-EPI Creatinine Equation (2021)    Anion gap 10 5 - 15    Comment: Performed at [facility], [address], [place], [place] [address]  CBC     Status: Abnormal   Collection Time: 08/02/22  2:18 PM  Result Value Ref Range   WBC 8.9 4.0 - 10.5 K/uL   RBC 4.21  (L) 4.22 - 5.81 MIL/uL   Hemoglobin 13.1 13.0 - 17.0 g/dL   HCT 01.6 01.0 - 93.2 %   MCV 93.3 80.0 - 100.0 fL   MCH 31.1 26.0 - 34.0 pg   MCHC 33.3 30.0 - 36.0 g/dL   RDW 35.5 73.2 -  15.5 %   Platelets 195 150 - 400 K/uL   nRBC 0.0 0.0 - 0.2 %    Comment: Performed at [facility], [address] [place], [place] [address]  POC CBG, ED     Status: None   Collection Time: 08/02/22  9:15 PM  Result Value Ref Range   Glucose-Capillary 77 70 - 99 mg/dL    Comment: Glucose reference range applies only to samples taken after fasting for at least 8 hours.  CBG monitoring, ED     Status: Abnormal   Collection Time: 08/02/22 10:08 PM  Result Value Ref Range   Glucose-Capillary 118 (H) 70 - 99 mg/dL    Comment: Glucose reference range applies only to samples taken after fasting for at least 8 hours.   CT ABDOMEN PELVIS W CONTRAST  Result Date: 08/02/2022 CLINICAL DATA:  Abdomen flank pain EXAM: CT ABDOMEN AND PELVIS WITH CONTRAST TECHNIQUE: Multidetector CT imaging of the abdomen and pelvis was performed using the standard protocol following bolus administration of intravenous contrast. RADIATION DOSE REDUCTION: This exam was performed according to the departmental dose-optimization program which includes automated exposure control, adjustment of the mA and/or kV according to patient size and/or use of iterative reconstruction technique. CONTRAST:  75mL OMNIPAQUE IOHEXOL 350 MG/ML SOLN COMPARISON:  None Available. FINDINGS: Lower chest: Lung bases demonstrate no acute airspace disease. Cardiomegaly. Coronary vascular calcification. Hepatobiliary: No focal liver abnormality is seen. No gallstones, gallbladder wall thickening, or biliary dilatation. Pancreas: Unremarkable. No pancreatic ductal dilatation or surrounding inflammatory changes. Spleen: Normal in size without focal abnormality. Adrenals/Urinary Tract: Adrenal glands are normal. Mild to moderate right hydronephrosis and proximal  hydroureter, secondary to a 10 x 9 mm stone in the proximal right ureter about 2.5 cm distal to the right UPJ. There is an additional punctate 2 mm stone slightly cranial to the larger stone. Moderate right perinephric fat stranding. Delayed excretion of contrast from right kidney consistent with obstruction. Bladder is unremarkable. Stomach/Bowel: Stomach is within normal limits. Appendix appears normal. No evidence of bowel wall thickening, distention, or inflammatory changes. Vascular/Lymphatic: Moderate aortic atherosclerosis. No aneurysm. No suspicious lymph nodes Reproductive: Enlarged prostate Other: Negative for pelvic effusion or free air. Small fat containing inguinal hernias. Negative for abdominal wall hernia other than small fat containing umbilical hernia Musculoskeletal: No acute osseous abnormality. IMPRESSION: 1. Mild to moderate right hydronephrosis and proximal hydroureter secondary to a 10 x 9 mm stone in the proximal right ureter about 2.5 cm distal to the right UPJ. There is an additional punctate 2 mm stone slightly cranial to the larger stone. Moderate right perinephric fat stranding. 2. Enlarged prostate. 3. Aortic atherosclerosis. Aortic Atherosclerosis (ICD10-I70.0). Electronically Signed   By: Jasmine Pang M.D.   On: 08/02/2022 18:49    Pending Labs [employer] (From admission, onward)     Start     Ordered   Signed and Held  HIV Antibody (routine testing w rflx)  (HIV Antibody (Routine testing w reflex) panel)  Add-on,   R        Signed and Held   Signed and Held  Comprehensive metabolic panel  Tomorrow morning,   R        Signed and Held   Signed and Held  CBC with Differential/Platelet  Tomorrow morning,   R        Signed and Held   Signed and Held  Hemoglobin A1c  Once,   R       Comments:  To assess prior glycemic control    Signed and Held            Vitals/Pain Today's Vitals   08/02/22 1900 08/02/22 1955 08/02/22 2117 08/02/22 2214  BP: 138/68   (!)  102/53  Pulse: (!) 56   (!) 54  Resp: 18   18  Temp: 97.6 F (36.4 C)   (!) 97.4 F (36.3 C)  TempSrc: Oral   Oral  SpO2: 95%   98%  Weight:      Height:      PainSc:  9  6      Isolation Precautions No active isolations  Medications Medications  HYDROmorphone (DILAUDID) injection 1 mg (has no administration in time range)  lactated ringers infusion (has no administration in time range)  HYDROmorphone (DILAUDID) injection 1 mg (has no administration in time range)  ondansetron (ZOFRAN-ODT) disintegrating tablet 4 mg (4 mg Oral Given 08/02/22 1422)  sodium chloride 0.9 % bolus 1,000 mL (0 mLs Intravenous Stopped 08/02/22 1955)  fentaNYL (SUBLIMAZE) injection 50 mcg (50 mcg Intravenous Given 08/02/22 1749)  iohexol (OMNIPAQUE) 350 MG/ML injection 75 mL (75 mLs Intravenous Contrast Given 08/02/22 1805)  morphine (PF) 4 MG/ML injection 4 mg (4 mg Intravenous Given 08/02/22 1850)  ondansetron (ZOFRAN) injection 4 mg (4 mg Intravenous Given 08/02/22 1924)  HYDROmorphone (DILAUDID) injection 1 mg (1 mg Intravenous Given 08/02/22 1924)  HYDROmorphone (DILAUDID) injection 1 mg (1 mg Intravenous Given 08/02/22 2016)  metoCLOPramide (REGLAN) injection 5 mg (5 mg Intravenous Given 08/02/22 2114)    Mobility walks     Focused Assessments Neuro Assessment Handoff:  Swallow screen pass? Yes          Neuro Assessment: Exceptions to WDL Neuro Checks:      Has TPA been given? No If patient is a Neuro Trauma and patient is going to OR before floor call report to 4N Charge nurse: [phone] or [phone]   R Recommendations: See Admitting Provider Note  Report given to:   Additional Notes:

## 2022-08-02 NOTE — Assessment & Plan Note (Signed)
Stable. Hold ACEI due to AKI. Continue with coreg.

## 2022-08-02 NOTE — Subjective & Objective (Signed)
CC: right sided abd pain HPI: 72 year old white male history of hypertension, type 2 diabetes, coronary disease status post drug-eluting stent 2023,, morbid obesity BMI 39, OSA on CPAP, prior history of kidney stones presents the ER today with a 2-day history of abdominal pain/right lower quadrant pain.  Patient states that he has had chronic back pain for several years.  Thought that this was an exacerbation of his back pain.  Patient was bring his wife down for medical office appointment today when he noted that the pain started to increase.  Patient was dropped off at the ER.  Patient noted that he started urinating blood yesterday.  This was intermittent.  Denies any fever or chills.  Had some nausea but no vomiting.  On Arrival temp 97.6 heart rate 61 blood pressure 130/69.  White count 8.9, hemoglobin 13.1, platelets 195  UA showed greater than 500 glucose negative nitrate negative leukocyte Estrace  Sodium 136, potassium 4.2, chloride 105, bicarb 21, BUN 23, creatinine 0.47  Lipase 36  CT abdomen pelvis demonstrated right moderate hydronephrosis and proximal hydroureter secondary to a 10 x 9 mm stone in the right proximal ureter about 2.5 cm from the UPJ.  There is an additional 2 mm stone slightly superior to the larger stone.  There is right perinephric stranding.  Patient required multiple doses of IV narcotics.  [facility] hospitalist contacted for admission.

## 2022-08-02 NOTE — Assessment & Plan Note (Signed)
Keep npo. Start SSI.

## 2022-08-02 NOTE — Assessment & Plan Note (Signed)
BMI 39.1

## 2022-08-02 NOTE — ED Notes (Signed)
Patient was given something to eat and drink  

## 2022-08-02 NOTE — ED Triage Notes (Addendum)
Pt c/o RLQ pain/swelling and hematuria since last night.  Pain score 8/10.  Pt reports taking prescribed pain medication w/o relief.  Hx of kidney stones.  RLQ noted to be swollen, but no pain w/ palpation and area is still soft.

## 2022-08-02 NOTE — Assessment & Plan Note (Signed)
Continue with cpap.  

## 2022-08-02 NOTE — ED Notes (Signed)
Patient transported to CT 

## 2022-08-02 NOTE — ED Notes (Signed)
Transfer set up with [facility]

## 2022-08-02 NOTE — Assessment & Plan Note (Addendum)
Continue statin and fenofibrate, coreg. Hold plavix and asa.

## 2022-08-02 NOTE — ED Provider Notes (Signed)
[place] EMERGENCY DEPARTMENT AT [facility] Provider Note   CSN: [id number] Arrival date & time: 08/02/22  1345     History  Chief Complaint  Patient presents with   Abdominal Pain   Hematuria    Douglas Aguirre is a 72 y.o. male with past medical history significant for hypertension, CAD, GERD, MI, kidney stones, diabetes presents to the ED complaining of right lower quadrant abdominal pain and swelling with hematuria since last night.  Patient states that his pain became more severe today and did not respond to prescribed pain medication.  He also has associated nausea.  Denies fever, dysuria, flank pain, difficulty urinating or decreased urine, vomiting, diarrhea.      Home Medications Prior to Admission medications   Medication Sig Start Date End Date Taking? Authorizing Provider  aspirin EC 81 MG tablet Take 81 mg by mouth in the morning.   Yes [provider]  Aspirin-Acetaminophen-Caffeine (GOODYS EXTRA STRENGTH) 316 [id number] MG PACK Take 1 packet by mouth as needed (for headaches).   Yes [provider]  carvedilol (COREG) 6.25 MG tablet Take 6.25-12.5 mg by mouth See admin instructions. Take 6.25 mg by mouth in the morning and 12.5 mg at bedtime   Yes [provider]  clopidogrel (PLAVIX) 75 MG tablet Take 75 mg by mouth at bedtime.   Yes [provider]  fenofibrate micronized (LOFIBRA) 134 MG capsule Take 134 mg by mouth at bedtime.   Yes [provider]  HYDROcodone-acetaminophen (NORCO) 10-325 MG tablet Take 1-2 tablets by mouth every 4 (four) hours as needed for severe pain. Patient taking differently: Take 1 tablet by mouth See admin instructions. Take 1 tablet by mouth four to six times a day as needed for pain 06/22/16  Yes Maeola Harman, MD  levocetirizine (XYZAL) 5 MG tablet Take 5 mg by mouth at bedtime.   Yes [provider]  levothyroxine (SYNTHROID, LEVOTHROID) 50 MCG tablet Take 50 mcg by mouth daily  before breakfast. 05/20/16  Yes [provider]  Multiple Vitamin (MULTIVITAMIN WITH MINERALS) TABS tablet Take 1 tablet by mouth daily with breakfast.   Yes [provider]  nitroGLYCERIN (NITROSTAT) 0.4 MG SL tablet Place 0.4 mg under the tongue every 5 (five) minutes as needed for chest pain.   Yes [provider]  omeprazole (PRILOSEC) 40 MG capsule Take 40 mg by mouth daily before breakfast.   Yes [provider]  ondansetron (ZOFRAN-ODT) 4 MG disintegrating tablet Take 8 mg by mouth daily as needed for nausea or vomiting.   Yes [provider]  OZEMPIC, 0.25 OR 0.5 MG/DOSE, 2 MG/3ML SOPN Inject 0.5 mg into the skin every Sunday.   Yes [provider]  pioglitazone (ACTOS) 30 MG tablet Take 30 mg by mouth in the morning.   Yes [provider]  ramipril (ALTACE) 10 MG capsule Take 10 mg by mouth in the morning.   Yes [provider]  rosuvastatin (CRESTOR) 40 MG tablet Take 40 mg by mouth at bedtime.   Yes [provider]  sertraline (ZOLOFT) 50 MG tablet Take 50 mg by mouth in the morning.   Yes [provider]  tamsulosin (FLOMAX) 0.4 MG CAPS capsule Take 0.4 mg by mouth in the morning.   Yes [provider]  traZODone (DESYREL) 100 MG tablet Take 100 mg by mouth at bedtime.   Yes [provider]  TRESIBA FLEXTOUCH 200 UNIT/ML FlexTouch Pen Inject 100 Units into the skin at  bedtime.   Yes [provider]  XIGDUO XR 06-998 MG TB24 Take 1 tablet by mouth in the morning and at bedtime.   Yes [provider]      Allergies    Alpha-gal and Niacin    Review of Systems   Review of Systems  Constitutional:  Negative for fever.  Gastrointestinal:  Positive for abdominal pain and nausea. Negative for diarrhea and vomiting.  Genitourinary:  Positive for hematuria. Negative for decreased urine volume, difficulty urinating, dysuria and flank pain.    Physical Exam Updated  Vital Signs BP (!) 102/53 (BP Location: Left Arm)   Pulse (!) 54   Temp (!) 97.4 F (36.3 C) (Oral)   Resp 18   Ht 5\' 9"  (1.753 m)   Wt 120.2 kg   SpO2 98%   BMI 39.13 kg/m  Physical Exam Vitals and nursing note reviewed.  Constitutional:      General: He is not in acute distress.    Appearance: Normal appearance. He is not ill-appearing or diaphoretic.  Pulmonary:     Effort: Pulmonary effort is normal.  Abdominal:     General: Abdomen is flat. Bowel sounds are normal.     Palpations: Abdomen is soft.     Tenderness: There is abdominal tenderness in the right lower quadrant. There is no right CVA tenderness, left CVA tenderness or guarding. Negative signs include McBurney's sign.     Hernia: No hernia is present.       Comments: RLQ appears mildly swollen when compared with LLQ.  There are no palpable hernias, overlying skin changes, or distention.  Neurological:     Mental Status: He is alert. Mental status is at baseline.  Psychiatric:        Mood and Affect: Mood normal.        Behavior: Behavior normal.     ED Results / Procedures / Treatments   Labs (all labs ordered are listed, but only abnormal results are displayed) Labs Reviewed  COMPREHENSIVE METABOLIC PANEL - Abnormal; Notable for the following components:      Result Value   CO2 21 (*)    Creatinine, Ser 1.47 (*)    Total Protein 6.4 (*)    Albumin 3.4 (*)    Alkaline Phosphatase 31 (*)    GFR, Estimated 51 (*)    All other components within normal limits  CBC - Abnormal; Notable for the following components:   RBC 4.21 (*)    All other components within normal limits  URINALYSIS, ROUTINE W REFLEX MICROSCOPIC - Abnormal; Notable for the following components:   Glucose, UA >=500 (*)    All other components within normal limits  CBG MONITORING, ED - Abnormal; Notable for the following components:   Glucose-Capillary 118 (*)    All other components within normal limits  LIPASE, BLOOD  CBG MONITORING,  ED    EKG None  Radiology CT ABDOMEN PELVIS W CONTRAST  Result Date: 08/02/2022 CLINICAL DATA:  Abdomen flank pain EXAM: CT ABDOMEN AND PELVIS WITH CONTRAST TECHNIQUE: Multidetector CT imaging of the abdomen and pelvis was performed using the standard protocol following bolus administration of intravenous contrast. RADIATION DOSE REDUCTION: This exam was performed according to the departmental dose-optimization program which includes automated exposure control, adjustment of the mA and/or kV according to patient size and/or use of iterative reconstruction technique. CONTRAST:  75mL OMNIPAQUE IOHEXOL 350 MG/ML SOLN COMPARISON:  None Available. FINDINGS: Lower chest: Lung bases demonstrate no acute airspace disease. Cardiomegaly.  Coronary vascular calcification. Hepatobiliary: No focal liver abnormality is seen. No gallstones, gallbladder wall thickening, or biliary dilatation. Pancreas: Unremarkable. No pancreatic ductal dilatation or surrounding inflammatory changes. Spleen: Normal in size without focal abnormality. Adrenals/Urinary Tract: Adrenal glands are normal. Mild to moderate right hydronephrosis and proximal hydroureter, secondary to a 10 x 9 mm stone in the proximal right ureter about 2.5 cm distal to the right UPJ. There is an additional punctate 2 mm stone slightly cranial to the larger stone. Moderate right perinephric fat stranding. Delayed excretion of contrast from right kidney consistent with obstruction. Bladder is unremarkable. Stomach/Bowel: Stomach is within normal limits. Appendix appears normal. No evidence of bowel wall thickening, distention, or inflammatory changes. Vascular/Lymphatic: Moderate aortic atherosclerosis. No aneurysm. No suspicious lymph nodes Reproductive: Enlarged prostate Other: Negative for pelvic effusion or free air. Small fat containing inguinal hernias. Negative for abdominal wall hernia other than small fat containing umbilical hernia Musculoskeletal: No acute  osseous abnormality. IMPRESSION: 1. Mild to moderate right hydronephrosis and proximal hydroureter secondary to a 10 x 9 mm stone in the proximal right ureter about 2.5 cm distal to the right UPJ. There is an additional punctate 2 mm stone slightly cranial to the larger stone. Moderate right perinephric fat stranding. 2. Enlarged prostate. 3. Aortic atherosclerosis. Aortic Atherosclerosis (ICD10-I70.0). Electronically Signed   By: Jasmine Pang M.D.   On: 08/02/2022 18:49    Procedures Procedures    Medications Ordered in ED Medications  lactated ringers infusion ( Intravenous New Bag/Given 08/02/22 2245)  HYDROmorphone (DILAUDID) injection 1 mg (has no administration in time range)  ondansetron (ZOFRAN-ODT) disintegrating tablet 4 mg (4 mg Oral Given 08/02/22 1422)  sodium chloride 0.9 % bolus 1,000 mL (0 mLs Intravenous Stopped 08/02/22 1955)  fentaNYL (SUBLIMAZE) injection 50 mcg (50 mcg Intravenous Given 08/02/22 1749)  iohexol (OMNIPAQUE) 350 MG/ML injection 75 mL (75 mLs Intravenous Contrast Given 08/02/22 1805)  morphine (PF) 4 MG/ML injection 4 mg (4 mg Intravenous Given 08/02/22 1850)  ondansetron (ZOFRAN) injection 4 mg (4 mg Intravenous Given 08/02/22 1924)  HYDROmorphone (DILAUDID) injection 1 mg (1 mg Intravenous Given 08/02/22 1924)  HYDROmorphone (DILAUDID) injection 1 mg (1 mg Intravenous Given 08/02/22 2016)  metoCLOPramide (REGLAN) injection 5 mg (5 mg Intravenous Given 08/02/22 2114)  HYDROmorphone (DILAUDID) injection 1 mg (1 mg Intravenous Given 08/02/22 2242)    ED Course/ Medical Decision Making/ A&P                             Medical Decision Making Amount and/or Complexity of Data Reviewed Labs: ordered.  Risk Prescription drug management.   This patient presents to the ED with chief complaint(s) of RLQ abdominal pain, hematuria with pertinent past medical history of HTN, kidney stones, CAD, MI, hernia, diabetes.  The complaint involves an extensive differential  diagnosis and also carries with it a high risk of complications and morbidity.    The differential diagnosis includes acute nephrolithiasis, ureterolithiasis, UTI, pyelonephritis, appendicitis, diverticulitis, cellulitis, malignancy   The initial plan is to obtain abdominal pain workup, CT abd/pelvis  Initial Assessment:   Exam significant for right lower quadrant abdominal tenderness without distention or guarding.  No pain at McBurney's point.  There is mild swelling appreciated when compared with the left lower quadrant.  No CVA tenderness.  No appreciable hernias or overlying skin changes.  Independent ECG/labs interpretation:  The following labs were independently interpreted:  CBC without leukocytosis or anemia.  Metabolic  panel with elevated creatinine and reduced GFR of 51.  No major electrolyte disturbances.  Lipase unremarkable.  UA with glucosuria but no evidence of infection.  Independent visualization and interpretation of imaging: I independently visualized the following imaging with scope of interpretation limited to determining acute life threatening conditions related to emergency care: CT abdomen pelvis, which revealed large right-sided kidney stone with hydronephrosis.  Radiologist reports stone is 10 x 9 mm and there is evidence of obstruction.  Treatment and Reassessment: Patient given IV fluids, antiemetics, and pain medicine.  Patient did not have much relief with fentanyl, was given morphine and will reassess.  Patient continues to have 9/10 pain and is pacing.  Will give dilaudid.    Patient has had multiple doses of dilaudid but states his pain quickly returns after medication.  He has associated nausea that is being well managed with Zofran.  Patient was able to sip on ginger ale.    Consultations obtained:   I requested consultation with on-call urologist and spoke with Dr. Berneice Heinrich who states that patient may follow-up outpatient if he does not have fever, tachycardia,  or uncontrollable vomiting.   Patient's pain has minimally improved despite multiple medications and his pain quickly returns.  I feel that patient would benefit from hospital admission for pain control.  I discussed patient case with hospitalist, Carollee Herter, DO who agreed with hospital admission.  Dr. Imogene Burn requested reaching back out to urology to see if procedure could be done tomorrow at [place].      Disposition:   Patient admitted to [facility] for intractable pain related to large right side kidney stone.  Unfortunately, did not hear back from on-call urology provider after re-paging him to inquire about potential treatment plan.  Oncoming PA-C made aware of pending re-consultation call.             Final Clinical Impression(s) / ED Diagnoses Final diagnoses:  Hydronephrosis with urinary obstruction due to renal calculus  Intractable abdominal pain    Rx / DC Orders ED Discharge Orders     None         Lenard Simmer, PA-C 08/02/22 2310    Rondel Baton, MD 08/03/22 1051

## 2022-08-03 ENCOUNTER — Encounter (HOSPITAL_COMMUNITY)
Admission: EM | Disposition: A | Discharge status: Home / Self Care | Payer: Self-pay | Source: Home / Self Care | Attending: Internal Medicine

## 2022-08-03 ENCOUNTER — Inpatient Hospital Stay (HOSPITAL_COMMUNITY): Payer: Medicare Other | Admitting: Certified Registered Nurse Anesthetist

## 2022-08-03 ENCOUNTER — Other Ambulatory Visit: Payer: Self-pay

## 2022-08-03 ENCOUNTER — Inpatient Hospital Stay (HOSPITAL_COMMUNITY): Payer: Medicare Other

## 2022-08-03 DIAGNOSIS — N132 Hydronephrosis with renal and ureteral calculous obstruction: Secondary | ICD-10-CM | POA: Diagnosis present

## 2022-08-03 DIAGNOSIS — Z7902 Long term (current) use of antithrombotics/antiplatelets: Secondary | ICD-10-CM | POA: Diagnosis not present

## 2022-08-03 DIAGNOSIS — G4733 Obstructive sleep apnea (adult) (pediatric): Secondary | ICD-10-CM | POA: Diagnosis present

## 2022-08-03 DIAGNOSIS — Z87891 Personal history of nicotine dependence: Secondary | ICD-10-CM

## 2022-08-03 DIAGNOSIS — Z888 Allergy status to other drugs, medicaments and biological substances status: Secondary | ICD-10-CM | POA: Diagnosis not present

## 2022-08-03 DIAGNOSIS — F32A Depression, unspecified: Secondary | ICD-10-CM | POA: Diagnosis present

## 2022-08-03 DIAGNOSIS — Z79890 Hormone replacement therapy (postmenopausal): Secondary | ICD-10-CM | POA: Diagnosis not present

## 2022-08-03 DIAGNOSIS — I2510 Atherosclerotic heart disease of native coronary artery without angina pectoris: Secondary | ICD-10-CM

## 2022-08-03 DIAGNOSIS — Z7985 Long-term (current) use of injectable non-insulin antidiabetic drugs: Secondary | ICD-10-CM | POA: Diagnosis not present

## 2022-08-03 DIAGNOSIS — E039 Hypothyroidism, unspecified: Secondary | ICD-10-CM | POA: Diagnosis present

## 2022-08-03 DIAGNOSIS — F419 Anxiety disorder, unspecified: Secondary | ICD-10-CM | POA: Diagnosis present

## 2022-08-03 DIAGNOSIS — N200 Calculus of kidney: Secondary | ICD-10-CM

## 2022-08-03 DIAGNOSIS — Z794 Long term (current) use of insulin: Secondary | ICD-10-CM | POA: Diagnosis not present

## 2022-08-03 DIAGNOSIS — Z7982 Long term (current) use of aspirin: Secondary | ICD-10-CM | POA: Diagnosis not present

## 2022-08-03 DIAGNOSIS — I252 Old myocardial infarction: Secondary | ICD-10-CM

## 2022-08-03 DIAGNOSIS — I10 Essential (primary) hypertension: Secondary | ICD-10-CM

## 2022-08-03 DIAGNOSIS — E11649 Type 2 diabetes mellitus with hypoglycemia without coma: Secondary | ICD-10-CM | POA: Diagnosis not present

## 2022-08-03 DIAGNOSIS — Z6841 Body Mass Index (BMI) 40.0 and over, adult: Secondary | ICD-10-CM | POA: Diagnosis not present

## 2022-08-03 DIAGNOSIS — E785 Hyperlipidemia, unspecified: Secondary | ICD-10-CM | POA: Diagnosis present

## 2022-08-03 DIAGNOSIS — M1990 Unspecified osteoarthritis, unspecified site: Secondary | ICD-10-CM | POA: Diagnosis present

## 2022-08-03 DIAGNOSIS — G8929 Other chronic pain: Secondary | ICD-10-CM | POA: Diagnosis present

## 2022-08-03 DIAGNOSIS — N179 Acute kidney failure, unspecified: Secondary | ICD-10-CM | POA: Diagnosis present

## 2022-08-03 DIAGNOSIS — Z7984 Long term (current) use of oral hypoglycemic drugs: Secondary | ICD-10-CM | POA: Diagnosis not present

## 2022-08-03 DIAGNOSIS — Z955 Presence of coronary angioplasty implant and graft: Secondary | ICD-10-CM | POA: Diagnosis not present

## 2022-08-03 HISTORY — PX: CYSTOSCOPY W/ URETERAL STENT PLACEMENT: SHX1429

## 2022-08-03 LAB — COMPREHENSIVE METABOLIC PANEL
ALT: 14 U/L (ref 0–44)
AST: 19 U/L (ref 15–41)
Albumin: 3.2 g/dL — ABNORMAL LOW (ref 3.5–5.0)
Alkaline Phosphatase: 25 U/L — ABNORMAL LOW (ref 38–126)
Anion gap: 7 (ref 5–15)
BUN: 21 mg/dL (ref 8–23)
CO2: 23 mmol/L (ref 22–32)
Calcium: 8.2 mg/dL — ABNORMAL LOW (ref 8.9–10.3)
Chloride: 106 mmol/L (ref 98–111)
Creatinine, Ser: 1.5 mg/dL — ABNORMAL HIGH (ref 0.61–1.24)
GFR, Estimated: 50 mL/min — ABNORMAL LOW (ref >60)
Glucose, Bld: 88 mg/dL (ref 70–99)
Potassium: 3.7 mmol/L (ref 3.5–5.1)
Sodium: 136 mmol/L (ref 135–145)
Total Bilirubin: 0.6 mg/dL (ref 0.3–1.2)
Total Protein: 5.9 g/dL — ABNORMAL LOW (ref 6.5–8.1)

## 2022-08-03 LAB — GLUCOSE, CAPILLARY
Glucose-Capillary: 109 mg/dL — ABNORMAL HIGH (ref 70–99)
Glucose-Capillary: 111 mg/dL — ABNORMAL HIGH (ref 70–99)
Glucose-Capillary: 117 mg/dL — ABNORMAL HIGH (ref 70–99)
Glucose-Capillary: 140 mg/dL — ABNORMAL HIGH (ref 70–99)
Glucose-Capillary: 183 mg/dL — ABNORMAL HIGH (ref 70–99)
Glucose-Capillary: 66 mg/dL — ABNORMAL LOW (ref 70–99)
Glucose-Capillary: 70 mg/dL (ref 70–99)
Glucose-Capillary: 76 mg/dL (ref 70–99)
Glucose-Capillary: 86 mg/dL (ref 70–99)

## 2022-08-03 LAB — CBC WITH DIFFERENTIAL/PLATELET
Abs Immature Granulocytes: 0.02 10*3/uL (ref 0.00–0.07)
Basophils Absolute: 0 10*3/uL (ref 0.0–0.1)
Basophils Relative: 1 %
Eosinophils Absolute: 0.2 10*3/uL (ref 0.0–0.5)
Eosinophils Relative: 2 %
HCT: 37 % — ABNORMAL LOW (ref 39.0–52.0)
Hemoglobin: 11.7 g/dL — ABNORMAL LOW (ref 13.0–17.0)
Immature Granulocytes: 0 %
Lymphocytes Relative: 33 %
Lymphs Abs: 2.6 10*3/uL (ref 0.7–4.0)
MCH: 30 pg (ref 26.0–34.0)
MCHC: 31.6 g/dL (ref 30.0–36.0)
MCV: 94.9 fL (ref 80.0–100.0)
Monocytes Absolute: 0.8 10*3/uL (ref 0.1–1.0)
Monocytes Relative: 10 %
Neutro Abs: 4.2 10*3/uL (ref 1.7–7.7)
Neutrophils Relative %: 54 %
Platelets: 170 10*3/uL (ref 150–400)
RBC: 3.9 MIL/uL — ABNORMAL LOW (ref 4.22–5.81)
RDW: 14.6 % (ref 11.5–15.5)
WBC: 7.8 10*3/uL (ref 4.0–10.5)
nRBC: 0 % (ref 0.0–0.2)

## 2022-08-03 LAB — HIV ANTIBODY (ROUTINE TESTING W REFLEX): HIV Screen 4th Generation wRfx: NONREACTIVE

## 2022-08-03 LAB — HEMOGLOBIN A1C
Hgb A1c MFr Bld: 6 % — ABNORMAL HIGH (ref 4.8–5.6)
Mean Plasma Glucose: 125.5 mg/dL

## 2022-08-03 SURGERY — CYSTOSCOPY, WITH RETROGRADE PYELOGRAM AND URETERAL STENT INSERTION
Anesthesia: General | Site: Ureter | Laterality: Right

## 2022-08-03 MED ORDER — SERTRALINE HCL 50 MG PO TABS
50.0000 mg | ORAL_TABLET | Freq: Every morning | ORAL | Status: DC
  Administered 2022-08-04: 50 mg via ORAL
  Filled 2022-08-03: qty 1

## 2022-08-03 MED ORDER — PHENYLEPHRINE 80 MCG/ML (10ML) SYRINGE FOR IV PUSH (FOR BLOOD PRESSURE SUPPORT)
PREFILLED_SYRINGE | INTRAVENOUS | Status: DC | PRN
  Administered 2022-08-03 (×2): 80 ug via INTRAVENOUS

## 2022-08-03 MED ORDER — TRAZODONE HCL 100 MG PO TABS
100.0000 mg | ORAL_TABLET | Freq: Every evening | ORAL | Status: DC | PRN
  Administered 2022-08-03: 100 mg via ORAL
  Filled 2022-08-03: qty 1

## 2022-08-03 MED ORDER — LEVOTHYROXINE SODIUM 50 MCG PO TABS
50.0000 ug | ORAL_TABLET | Freq: Every day | ORAL | Status: DC
  Administered 2022-08-04: 50 ug via ORAL
  Filled 2022-08-03: qty 1

## 2022-08-03 MED ORDER — 0.9 % SODIUM CHLORIDE (POUR BTL) OPTIME
TOPICAL | Status: DC | PRN
  Administered 2022-08-03: 1000 mL

## 2022-08-03 MED ORDER — PHENYLEPHRINE 80 MCG/ML (10ML) SYRINGE FOR IV PUSH (FOR BLOOD PRESSURE SUPPORT)
PREFILLED_SYRINGE | INTRAVENOUS | Status: AC
  Filled 2022-08-03: qty 10

## 2022-08-03 MED ORDER — OXYCODONE HCL 5 MG PO TABS
ORAL_TABLET | ORAL | Status: AC
  Filled 2022-08-03: qty 1

## 2022-08-03 MED ORDER — FENTANYL CITRATE (PF) 100 MCG/2ML IJ SOLN
INTRAMUSCULAR | Status: AC
  Filled 2022-08-03: qty 2

## 2022-08-03 MED ORDER — HYDROMORPHONE HCL 1 MG/ML IJ SOLN
0.5000 mg | Freq: Once | INTRAMUSCULAR | Status: AC
  Administered 2022-08-03: 0.5 mg via INTRAVENOUS

## 2022-08-03 MED ORDER — HYDROMORPHONE HCL 1 MG/ML IJ SOLN
0.2500 mg | INTRAMUSCULAR | Status: DC | PRN
  Administered 2022-08-03: 0.5 mg via INTRAVENOUS

## 2022-08-03 MED ORDER — LIDOCAINE 2% (20 MG/ML) 5 ML SYRINGE
INTRAMUSCULAR | Status: DC | PRN
  Administered 2022-08-03: 20 mg via INTRAVENOUS

## 2022-08-03 MED ORDER — ONDANSETRON HCL 4 MG/2ML IJ SOLN
4.0000 mg | Freq: Four times a day (QID) | INTRAMUSCULAR | Status: DC | PRN
  Administered 2022-08-03 (×2): 4 mg via INTRAVENOUS
  Filled 2022-08-03 (×2): qty 2

## 2022-08-03 MED ORDER — ONDANSETRON HCL 4 MG/2ML IJ SOLN
INTRAMUSCULAR | Status: AC
  Filled 2022-08-03: qty 2

## 2022-08-03 MED ORDER — INSULIN ASPART 100 UNIT/ML IJ SOLN: 0.0000 [IU] | Freq: Three times a day (TID) | INTRAMUSCULAR | Status: DC

## 2022-08-03 MED ORDER — PROPOFOL 10 MG/ML IV BOLUS
INTRAVENOUS | Status: AC
  Filled 2022-08-03: qty 20

## 2022-08-03 MED ORDER — OXYCODONE HCL 5 MG/5ML PO SOLN: 5.0000 mg | Freq: Once | ORAL | Status: AC | PRN

## 2022-08-03 MED ORDER — ORAL CARE MOUTH RINSE: 15.0000 mL | Freq: Once | OROMUCOSAL | Status: AC

## 2022-08-03 MED ORDER — DEXAMETHASONE SODIUM PHOSPHATE 10 MG/ML IJ SOLN
INTRAMUSCULAR | Status: AC
  Filled 2022-08-03: qty 1

## 2022-08-03 MED ORDER — DEXTROSE 50 % IV SOLN
25.0000 mL | INTRAVENOUS | Status: DC | PRN
  Administered 2022-08-03: 25 mL via INTRAVENOUS
  Filled 2022-08-03: qty 50

## 2022-08-03 MED ORDER — INSULIN GLARGINE-YFGN 100 UNIT/ML ~~LOC~~ SOLN
25.0000 [IU] | Freq: Every day | SUBCUTANEOUS | Status: DC
  Administered 2022-08-03: 25 [IU] via SUBCUTANEOUS
  Filled 2022-08-03 (×2): qty 0.25

## 2022-08-03 MED ORDER — FENTANYL CITRATE (PF) 100 MCG/2ML IJ SOLN
INTRAMUSCULAR | Status: DC | PRN
  Administered 2022-08-03: 50 ug via INTRAVENOUS
  Administered 2022-08-03: 100 ug via INTRAVENOUS
  Administered 2022-08-03: 50 ug via INTRAVENOUS

## 2022-08-03 MED ORDER — TRAZODONE HCL 100 MG PO TABS
100.0000 mg | ORAL_TABLET | Freq: Every day | ORAL | Status: DC
  Administered 2022-08-03: 100 mg via ORAL
  Filled 2022-08-03: qty 1

## 2022-08-03 MED ORDER — ACETAMINOPHEN 500 MG PO TABS
1000.0000 mg | ORAL_TABLET | Freq: Once | ORAL | Status: AC
  Administered 2022-08-03: 1000 mg via ORAL
  Filled 2022-08-03: qty 2

## 2022-08-03 MED ORDER — IOHEXOL 300 MG/ML  SOLN
INTRAMUSCULAR | Status: DC | PRN
  Administered 2022-08-03: 12 mL

## 2022-08-03 MED ORDER — CEFAZOLIN SODIUM-DEXTROSE 2-4 GM/100ML-% IV SOLN
INTRAVENOUS | Status: AC
  Filled 2022-08-03: qty 100

## 2022-08-03 MED ORDER — POLYETHYLENE GLYCOL 3350 17 G PO PACK
17.0000 g | PACK | Freq: Every day | ORAL | Status: DC
  Administered 2022-08-03: 17 g via ORAL
  Filled 2022-08-03 (×2): qty 1

## 2022-08-03 MED ORDER — ACETAMINOPHEN 650 MG RE SUPP: 650.0000 mg | Freq: Four times a day (QID) | RECTAL | Status: DC | PRN

## 2022-08-03 MED ORDER — ONDANSETRON 4 MG PO TBDP: 8.0000 mg | ORAL_TABLET | Freq: Every day | ORAL | Status: DC | PRN

## 2022-08-03 MED ORDER — CARVEDILOL 12.5 MG PO TABS
12.5000 mg | ORAL_TABLET | Freq: Every day | ORAL | Status: DC
  Administered 2022-08-03: 12.5 mg via ORAL
  Filled 2022-08-03: qty 1

## 2022-08-03 MED ORDER — SENNOSIDES-DOCUSATE SODIUM 8.6-50 MG PO TABS
2.0000 | ORAL_TABLET | Freq: Every day | ORAL | Status: DC
  Administered 2022-08-03: 2 via ORAL
  Filled 2022-08-03: qty 2

## 2022-08-03 MED ORDER — ONDANSETRON HCL 4 MG PO TABS
4.0000 mg | ORAL_TABLET | Freq: Four times a day (QID) | ORAL | Status: DC | PRN
  Administered 2022-08-03: 4 mg via ORAL
  Filled 2022-08-03: qty 1

## 2022-08-03 MED ORDER — PROMETHAZINE HCL 25 MG/ML IJ SOLN: 6.2500 mg | INTRAMUSCULAR | Status: DC | PRN

## 2022-08-03 MED ORDER — EPHEDRINE 5 MG/ML INJ
INTRAVENOUS | Status: AC
  Filled 2022-08-03: qty 5

## 2022-08-03 MED ORDER — CLOPIDOGREL BISULFATE 75 MG PO TABS
75.0000 mg | ORAL_TABLET | Freq: Every day | ORAL | Status: DC
  Administered 2022-08-03 – 2022-08-04 (×2): 75 mg via ORAL
  Filled 2022-08-03 (×2): qty 1

## 2022-08-03 MED ORDER — SUCCINYLCHOLINE CHLORIDE 200 MG/10ML IV SOSY
PREFILLED_SYRINGE | INTRAVENOUS | Status: AC
  Filled 2022-08-03: qty 10

## 2022-08-03 MED ORDER — INSULIN ASPART 100 UNIT/ML IJ SOLN: 0.0000 [IU] | INTRAMUSCULAR | Status: DC | PRN

## 2022-08-03 MED ORDER — MIDAZOLAM HCL 2 MG/2ML IJ SOLN: 0.5000 mg | Freq: Once | INTRAMUSCULAR | Status: DC | PRN

## 2022-08-03 MED ORDER — LIDOCAINE HCL (PF) 2 % IJ SOLN
INTRAMUSCULAR | Status: AC
  Filled 2022-08-03: qty 5

## 2022-08-03 MED ORDER — INSULIN ASPART 100 UNIT/ML IJ SOLN: 0.0000 [IU] | INTRAMUSCULAR | Status: DC

## 2022-08-03 MED ORDER — EPHEDRINE SULFATE-NACL 50-0.9 MG/10ML-% IV SOSY
PREFILLED_SYRINGE | INTRAVENOUS | Status: DC | PRN
  Administered 2022-08-03: 5 mg via INTRAVENOUS

## 2022-08-03 MED ORDER — MEPERIDINE HCL 50 MG/ML IJ SOLN: 6.2500 mg | INTRAMUSCULAR | Status: DC | PRN

## 2022-08-03 MED ORDER — ACETAMINOPHEN 325 MG PO TABS: 650.0000 mg | ORAL_TABLET | Freq: Four times a day (QID) | ORAL | Status: DC | PRN

## 2022-08-03 MED ORDER — CHLORHEXIDINE GLUCONATE 0.12 % MT SOLN
15.0000 mL | Freq: Once | OROMUCOSAL | Status: AC
  Administered 2022-08-03: 15 mL via OROMUCOSAL

## 2022-08-03 MED ORDER — OXYCODONE HCL 5 MG PO TABS
5.0000 mg | ORAL_TABLET | Freq: Once | ORAL | Status: AC | PRN
  Administered 2022-08-03: 5 mg via ORAL

## 2022-08-03 MED ORDER — ROSUVASTATIN CALCIUM 20 MG PO TABS
40.0000 mg | ORAL_TABLET | Freq: Every day | ORAL | Status: DC
  Administered 2022-08-03: 40 mg via ORAL
  Filled 2022-08-03: qty 2

## 2022-08-03 MED ORDER — SODIUM CHLORIDE 0.9 % IR SOLN
Status: DC | PRN
  Administered 2022-08-03: 3000 mL

## 2022-08-03 MED ORDER — LACTATED RINGERS IV SOLN: INTRAVENOUS | Status: DC

## 2022-08-03 MED ORDER — HYDROMORPHONE HCL 1 MG/ML IJ SOLN
INTRAMUSCULAR | Status: AC
  Filled 2022-08-03: qty 1

## 2022-08-03 MED ORDER — RAMIPRIL 5 MG PO CAPS
10.0000 mg | ORAL_CAPSULE | Freq: Every morning | ORAL | Status: DC
  Administered 2022-08-04: 10 mg via ORAL
  Filled 2022-08-03: qty 2

## 2022-08-03 MED ORDER — TAMSULOSIN HCL 0.4 MG PO CAPS
0.4000 mg | ORAL_CAPSULE | Freq: Every morning | ORAL | Status: DC
  Administered 2022-08-04: 0.4 mg via ORAL
  Filled 2022-08-03: qty 1

## 2022-08-03 MED ORDER — ASPIRIN 81 MG PO TBEC
81.0000 mg | DELAYED_RELEASE_TABLET | Freq: Every morning | ORAL | Status: DC
  Administered 2022-08-04: 81 mg via ORAL
  Filled 2022-08-03: qty 1

## 2022-08-03 MED ORDER — CEFAZOLIN SODIUM-DEXTROSE 2-3 GM-%(50ML) IV SOLR
INTRAVENOUS | Status: DC | PRN
  Administered 2022-08-03: 2 g via INTRAVENOUS

## 2022-08-03 MED ORDER — PROPOFOL 10 MG/ML IV BOLUS
INTRAVENOUS | Status: DC | PRN
  Administered 2022-08-03: 150 mg via INTRAVENOUS

## 2022-08-03 MED ORDER — CARVEDILOL 6.25 MG PO TABS
6.2500 mg | ORAL_TABLET | Freq: Every day | ORAL | Status: DC
  Administered 2022-08-04: 6.25 mg via ORAL
  Filled 2022-08-03: qty 1

## 2022-08-03 SURGICAL SUPPLY — 17 items
BAG URO CATCHER STRL LF (MISCELLANEOUS) ×1 IMPLANT
BASKET ZERO TIP NITINOL 2.4FR (BASKET) IMPLANT
BSKT STON RTRVL ZERO TP 2.4FR (BASKET)
CATH URETL OPEN END 6FR 70 (CATHETERS) IMPLANT
CLOTH BEACON ORANGE TIMEOUT ST (SAFETY) ×1 IMPLANT
GLOVE SURG LX STRL 7.5 STRW (GLOVE) ×1 IMPLANT
GOWN STRL REUS W/ TWL XL LVL3 (GOWN DISPOSABLE) ×1 IMPLANT
GOWN STRL REUS W/TWL XL LVL3 (GOWN DISPOSABLE) ×1
GUIDEWIRE ANG ZIPWIRE 038X150 (WIRE) ×1 IMPLANT
GUIDEWIRE STR DUAL SENSOR (WIRE) IMPLANT
KIT TURNOVER KIT A (KITS) IMPLANT
MANIFOLD NEPTUNE II (INSTRUMENTS) ×1 IMPLANT
PACK CYSTO (CUSTOM PROCEDURE TRAY) ×1 IMPLANT
STENT POLARIS 5FRX26 (STENTS) IMPLANT
TUBE PU 8FR 16IN ENFIT (TUBING) IMPLANT
TUBING CONNECTING 10 (TUBING) ×1 IMPLANT
TUBING UROLOGY SET (TUBING) IMPLANT

## 2022-08-03 NOTE — Op Note (Unsigned)
NAMENILES, LAWRIMORE MEDICAL RECORD NO: [id number] ACCOUNT NO: [id number] DATE OF BIRTH: 1950-02-16 FACILITY: [facility] LOCATION: [facility] PHYSICIAN: Sebastian Ache, MD  Operative Report   DATE OF PROCEDURE: 08/03/2022  PREOPERATIVE DIAGNOSIS:  Right ureteral stone, refractory colic.  PROCEDURE:   1.  Cystoscopy, right retrograde pyelogram and interpretation. 2.  Insertion of right ureteral stent.  ESTIMATED BLOOD LOSS:  Nil.  COMPLICATIONS:  None.  SPECIMEN:  None.  FINDINGS:   1.  Right hydronephrosis to large right proximal ureteral stone. 2.  Successful placement of right ureteral stent, proximal end in upper pole, distal end in urinary bladder.  INDICATIONS:  The patient is a 72 year old man from [place] with a history of recurrent urolithiasis.  He was found on workup of colicky flank pain having a large right proximal ureteral stone.  He was admitted overnight to the hospitalist  service and consultation was sought this morning for management of stone.  He does have significant diabetes with glycosuria off the charts despite not on any SGOT inhibitors, was concerning for possible likely to have infection.  Given the very large  nature of the stone and his blood thinner usage, it was felt that the most prudent means of management would be a staged approach placing a stent today to allow for renal decompression and clearance of any infectious parameters followed by ureteroscopy  in the elective setting.  He wished to proceed.  Informed consent was obtained and placed in medical record.  PROCEDURE IN DETAIL:  The patient being identified, verified. Procedure being right ureteral stent placement was confirmed.  Procedure timeout was performed.  Intravenous antibiotics were administered.  General anesthesia introduced.  The patient was  placed into a low lithotomy position.  Sterile field was created, prepped and draped the patient's penis, perineum, and proximal thighs using  iodine.  Cystourethroscopy was performed using 21-French rigid cystoscope with offset lens. Inspection of the  anterior and posterior urethra was unremarkable.  Inspection of urinary bladder revealed no diverticula, calcifications, papillary lesions.  Right ureteral orifice was cannulated with a 6-French end-hole catheter, and right retrograde pyelogram was  obtained.  Right retrograde pyelogram demonstrated single right ureter, single system right kidney.  There was a significant filling defect in the proximal ureter consistent with known stone, hydronephrosis above this.  0.038 ZIPwire was navigated next to the  stone.  The upper pole and a new 5 x 26 Polaris type stent was placed using cystoscopic and fluoroscopic guidance.  Good proximal and distal planes were noted.  There was copious efflux of urine around the distal end of the stent and the procedure was  terminated.  The patient tolerated procedure well, no immediate perioperative complications.  The patient taken to postanesthesia care unit in stable condition.  He likely be able to discharge home in the future and we will plan for elective ureteroscopy  in several weeks pending resolution of any infectious parameters.   PUS D: 08/03/2022 2:09:47 pm T: 08/03/2022 3:53:00 pm  JOB: [id number]/ [id number]

## 2022-08-03 NOTE — Discharge Instructions (Signed)
1 - You may have urinary urgency (bladder spasms) and bloody urine on / off with stent in place. This is normal. ° °2 - Call MD or go to ER for fever >102, severe pain / nausea / vomiting not relieved by medications, or acute change in medical status ° °

## 2022-08-03 NOTE — Consult Note (Signed)
Reason for Consult:RIGHT Ureteral Stone  Referring Physician: Cecile Hearing MD  Douglas Aguirre is an 72 y.o. male.   HPI:   1 - Recurrent Urolithiasis -  Pre 2024 - SWL x few, MET x few 07/2022 - 13mm ovoid Rt proximal ureteral stone, few punctate Rt renal stones on eval flank pain.  2- Medical Stone Disease-  Eval 2024 :BMP, PTH, Urate - pending; Composition - pending; 24 Hr Urines pending  PMH sig for CAD/MI/Stent/Plavix (follows [facility]), obesity, IDDM2, benign thyroid surgery.   Today "Douglas Aguirre" is seen in consultation for above. Admitted overnight with significant colic form large stone that has been difficult to control. NPO since midnight. UA with off the charts glycosuria.    Past Medical History:  Diagnosis Date   Anxiety    Arthritis    degenerative joints, back- upper & lower    Coronary artery disease    Depression    Diabetes mellitus without complication (HCC)    Diabetes mellitus, type 2 (HCC)    GERD (gastroesophageal reflux disease)    Herniated cervical disc 06/21/2016   History of hiatal hernia    History of kidney stones    passed spontaneously, & has cystoscopy & lithotripsy   Hypertension    Hypothyroidism    Myocardial infarction [facility])    Neuromuscular disorder (HCC)    carpal tunnel syndrome - both hands    OSA on CPAP    Sleep apnea 2013   last study- cpap    Past Surgical History:  Procedure Laterality Date   ANTERIOR CERVICAL DECOMP/DISCECTOMY FUSION N/A 06/21/2016   Procedure: Cervical Six-Seven Anterior cervical decompression/discectomy/fusion; LEFT CARPAL TUNNEL RELEASE;  Surgeon: Maeola Harman, MD;  Location: [facility] OR;  Service: Neurosurgery;  Laterality: N/A;  left side approach   CARPAL TUNNEL RELEASE Right 2017   CARPAL TUNNEL RELEASE Left 06/21/2016   Procedure: LEFT CARPAL TUNNEL RELEASE;  Surgeon: Maeola Harman, MD;  Location: [facility] OR;  Service: Neurosurgery;  Laterality: Left;  LEFT    CARPAL TUNNEL RELEASE Right  08/19/2016   Procedure: RIGHT CARPAL TUNNEL RELEASE;  Surgeon: Maeola Harman, MD;  Location: [facility] OR;  Service: Neurosurgery;  Laterality: Right;  RIGHT CARPAL TUNNEL RELEASE   CORONARY ANGIOPLASTY     Taxus dRCA 04/2006, Xience DES mLAD and dRCA 09/2006 ([place])   CORONARY ANGIOPLASTY WITH STENT PLACEMENT     EYE SURGERY Bilateral    cataracts removed- by laser    NASAL SINUS SURGERY     turbinate reduction, palate surgery at the same time.    SHOULDER ARTHROSCOPY Left 2017   for rotator cuff x2   THYROIDECTOMY, PARTIAL  2005    History reviewed. No pertinent family history.  Social History:  reports that he quit smoking about 11 years ago. He has never used smokeless tobacco. He reports that he does not drink alcohol and does not use drugs.  Allergies:  Allergies  Allergen Reactions   Alpha-Gal Other (See Comments)   Niacin Other (See Comments)    Redness and burning    Medications: I have reviewed the patient's current medications.  Results for orders placed or performed during the hospital encounter of 08/02/22 (from the past 48 hour(s))  Urinalysis, Routine w reflex microscopic -Urine, Clean Catch     Status: Abnormal   Collection Time: 08/02/22  2:04 PM  Result Value Ref Range   Color, Urine YELLOW YELLOW   APPearance CLEAR CLEAR   Specific Gravity, Urine 1.013 1.005 - 1.030  pH 6.0 5.0 - 8.0   Glucose, UA >=500 (A) NEGATIVE mg/dL   Hgb urine dipstick NEGATIVE NEGATIVE   Bilirubin Urine NEGATIVE NEGATIVE   Ketones, ur NEGATIVE NEGATIVE mg/dL   Protein, ur NEGATIVE NEGATIVE mg/dL   Nitrite NEGATIVE NEGATIVE   Leukocytes,Ua NEGATIVE NEGATIVE   RBC / HPF 6-10 0 - 5 RBC/hpf   WBC, UA 0-5 0 - 5 WBC/hpf   Bacteria, UA NONE SEEN NONE SEEN   Squamous Epithelial / HPF 0-5 0 - 5 /HPF   Mucus PRESENT     Comment: Performed at [facility], [address] [place], [place] [address]  Lipase, blood     Status: None   Collection Time: 08/02/22  2:18 PM  Result  Value Ref Range   Lipase 36 11 - 51 U/L    Comment: Performed at [facility], [address], [place], [place] [address]  Comprehensive metabolic panel     Status: Abnormal   Collection Time: 08/02/22  2:18 PM  Result Value Ref Range   Sodium 136 135 - 145 mmol/L   Potassium 4.2 3.5 - 5.1 mmol/L   Chloride 105 98 - 111 mmol/L   CO2 21 (L) 22 - 32 mmol/L   Glucose, Bld 82 70 - 99 mg/dL    Comment: Glucose reference range applies only to samples taken after fasting for at least 8 hours.   BUN 23 8 - 23 mg/dL   Creatinine, Ser 0.98 (H) 0.61 - 1.24 mg/dL   Calcium 9.0 8.9 - 11.9 mg/dL   Total Protein 6.4 (L) 6.5 - 8.1 g/dL   Albumin 3.4 (L) 3.5 - 5.0 g/dL   AST 23 15 - 41 U/L   ALT 15 0 - 44 U/L   Alkaline Phosphatase 31 (L) 38 - 126 U/L   Total Bilirubin 0.7 0.3 - 1.2 mg/dL   GFR, Estimated 51 (L) >60 mL/min    Comment: (NOTE) Calculated using the CKD-EPI Creatinine Equation (2021)    Anion gap 10 5 - 15    Comment: Performed at [facility], [address] [place], [place] [address]  CBC     Status: Abnormal   Collection Time: 08/02/22  2:18 PM  Result Value Ref Range   WBC 8.9 4.0 - 10.5 K/uL   RBC 4.21 (L) 4.22 - 5.81 MIL/uL   Hemoglobin 13.1 13.0 - 17.0 g/dL   HCT 95.6 21.3 - 08.6 %   MCV 93.3 80.0 - 100.0 fL   MCH 31.1 26.0 - 34.0 pg   MCHC 33.3 30.0 - 36.0 g/dL   RDW 57.8 46.9 - 62.9 %   Platelets 195 150 - 400 K/uL   nRBC 0.0 0.0 - 0.2 %    Comment: Performed at [facility], [address], [place], [place] [address]  POC CBG, ED     Status: None   Collection Time: 08/02/22  9:15 PM  Result Value Ref Range   Glucose-Capillary 77 70 - 99 mg/dL    Comment: Glucose reference range applies only to samples taken after fasting for at least 8 hours.  CBG monitoring, ED     Status: Abnormal   Collection Time: 08/02/22 10:08 PM  Result Value Ref Range   Glucose-Capillary 118 (H) 70 - 99 mg/dL    Comment: Glucose reference range applies only to samples  taken after fasting for at least 8 hours.  Glucose, capillary     Status: Abnormal   Collection  Time: 08/03/22  3:57 AM  Result Value Ref Range   Glucose-Capillary 109 (H) 70 - 99 mg/dL    Comment: Glucose reference range applies only to samples taken after fasting for at least 8 hours.  Comprehensive metabolic panel     Status: Abnormal   Collection Time: 08/03/22  6:00 AM  Result Value Ref Range   Sodium 136 135 - 145 mmol/L   Potassium 3.7 3.5 - 5.1 mmol/L   Chloride 106 98 - 111 mmol/L   CO2 23 22 - 32 mmol/L   Glucose, Bld 88 70 - 99 mg/dL    Comment: Glucose reference range applies only to samples taken after fasting for at least 8 hours.   BUN 21 8 - 23 mg/dL   Creatinine, Ser 1.61 (H) 0.61 - 1.24 mg/dL   Calcium 8.2 (L) 8.9 - 10.3 mg/dL   Total Protein 5.9 (L) 6.5 - 8.1 g/dL   Albumin 3.2 (L) 3.5 - 5.0 g/dL   AST 19 15 - 41 U/L   ALT 14 0 - 44 U/L   Alkaline Phosphatase 25 (L) 38 - 126 U/L   Total Bilirubin 0.6 0.3 - 1.2 mg/dL   GFR, Estimated 50 (L) >60 mL/min    Comment: (NOTE) Calculated using the CKD-EPI Creatinine Equation (2021)    Anion gap 7 5 - 15    Comment: Performed at [facility], [address], [place], [place] [address]  CBC with Differential/Platelet     Status: Abnormal   Collection Time: 08/03/22  6:00 AM  Result Value Ref Range   WBC 7.8 4.0 - 10.5 K/uL   RBC 3.90 (L) 4.22 - 5.81 MIL/uL   Hemoglobin 11.7 (L) 13.0 - 17.0 g/dL   HCT 54.0 (L) 98.1 - 19.1 %   MCV 94.9 80.0 - 100.0 fL   MCH 30.0 26.0 - 34.0 pg   MCHC 31.6 30.0 - 36.0 g/dL   RDW 47.8 29.5 - 62.1 %   Platelets 170 150 - 400 K/uL   nRBC 0.0 0.0 - 0.2 %   Neutrophils Relative % 54 %   Neutro Abs 4.2 1.7 - 7.7 K/uL   Lymphocytes Relative 33 %   Lymphs Abs 2.6 0.7 - 4.0 K/uL   Monocytes Relative 10 %   Monocytes Absolute 0.8 0.1 - 1.0 K/uL   Eosinophils Relative 2 %   Eosinophils Absolute 0.2 0.0 - 0.5 K/uL   Basophils Relative 1 %   Basophils Absolute 0.0 0.0 -  0.1 K/uL   Immature Granulocytes 0 %   Abs Immature Granulocytes 0.02 0.00 - 0.07 K/uL    Comment: Performed at [facility], [address], [place], [place] [address]  Glucose, capillary     Status: None   Collection Time: 08/03/22  7:22 AM  Result Value Ref Range   Glucose-Capillary 86 70 - 99 mg/dL    Comment: Glucose reference range applies only to samples taken after fasting for at least 8 hours.    CT ABDOMEN PELVIS W CONTRAST  Result Date: 08/02/2022 CLINICAL DATA:  Abdomen flank pain EXAM: CT ABDOMEN AND PELVIS WITH CONTRAST TECHNIQUE: Multidetector CT imaging of the abdomen and pelvis was performed using the standard protocol following bolus administration of intravenous contrast. RADIATION DOSE REDUCTION: This exam was performed according to the departmental dose-optimization program which includes automated exposure control, adjustment of the mA and/or kV according to patient size and/or use of iterative reconstruction technique. CONTRAST:  75mL OMNIPAQUE IOHEXOL 350 MG/ML  SOLN COMPARISON:  None Available. FINDINGS: Lower chest: Lung bases demonstrate no acute airspace disease. Cardiomegaly. Coronary vascular calcification. Hepatobiliary: No focal liver abnormality is seen. No gallstones, gallbladder wall thickening, or biliary dilatation. Pancreas: Unremarkable. No pancreatic ductal dilatation or surrounding inflammatory changes. Spleen: Normal in size without focal abnormality. Adrenals/Urinary Tract: Adrenal glands are normal. Mild to moderate right hydronephrosis and proximal hydroureter, secondary to a 10 x 9 mm stone in the proximal right ureter about 2.5 cm distal to the right UPJ. There is an additional punctate 2 mm stone slightly cranial to the larger stone. Moderate right perinephric fat stranding. Delayed excretion of contrast from right kidney consistent with obstruction. Bladder is unremarkable. Stomach/Bowel: Stomach is within normal limits. Appendix appears  normal. No evidence of bowel wall thickening, distention, or inflammatory changes. Vascular/Lymphatic: Moderate aortic atherosclerosis. No aneurysm. No suspicious lymph nodes Reproductive: Enlarged prostate Other: Negative for pelvic effusion or free air. Small fat containing inguinal hernias. Negative for abdominal wall hernia other than small fat containing umbilical hernia Musculoskeletal: No acute osseous abnormality. IMPRESSION: 1. Mild to moderate right hydronephrosis and proximal hydroureter secondary to a 10 x 9 mm stone in the proximal right ureter about 2.5 cm distal to the right UPJ. There is an additional punctate 2 mm stone slightly cranial to the larger stone. Moderate right perinephric fat stranding. 2. Enlarged prostate. 3. Aortic atherosclerosis. Aortic Atherosclerosis (ICD10-I70.0). Electronically Signed   By: Jasmine Pang M.D.   On: 08/02/2022 18:49    Review of Systems  Constitutional:  Negative for chills and fever.  Gastrointestinal:  Positive for nausea.  Genitourinary:  Positive for flank pain.  All other systems reviewed and are negative.  Blood pressure (!) 111/58, pulse 61, temperature 97.9 F (36.6 C), temperature source Oral, resp. rate 20, height 5\' 9"  (1.753 m), weight 123.2 kg, SpO2 94 %. Physical Exam Vitals reviewed.  Constitutional:      Comments: Pleasant, some visible pain / colic  Eyes:     Extraocular Movements: Extraocular movements intact.  Cardiovascular:     Rate and Rhythm: Normal rate.  Pulmonary:     Effort: Pulmonary effort is normal.  Abdominal:     Comments: Very large truncal obesity.   Genitourinary:    Comments: Moderate Rt CVAT Skin:    General: Skin is warm.  Neurological:     General: No focal deficit present.     Mental Status: He is alert.  Psychiatric:        Mood and Affect: Mood normal.     Assessment/Plan:  Rec semi-urgent Rt stent today to allow for renal decompression, then ureterosopy in elective setting as that can  be done on plavix. Risks, benefits, alterantives, expected peri-op course discussed. He has good understanding.  Greatly appreciate Dr. Charlean Sanfilippo comanagement.   Loletta Parish. 08/03/2022, 8:35 AM

## 2022-08-03 NOTE — Brief Op Note (Signed)
08/02/2022 - 08/03/2022  2:05 PM  PATIENT:  Douglas Aguirre  72 y.o. male  PRE-OPERATIVE DIAGNOSIS:  Right Ureteral Stone  POST-OPERATIVE DIAGNOSIS:  Right Ureteral Stone  PROCEDURE:  Procedure(s): CYSTOSCOPY WITH RETROGRADE PYELOGRAM/URETERAL STENT PLACEMENT (Right)  SURGEON:  Surgeon(s) and Role:    * Neidra Girvan, Delbert Phenix., MD - Primary  PHYSICIAN ASSISTANT:   ASSISTANTS: none   ANESTHESIA:   general  EBL:  0 mL   BLOOD ADMINISTERED:none  DRAINS: none   LOCAL MEDICATIONS USED:  none  SPECIMEN:  No Specimen  DISPOSITION OF SPECIMEN:  N/A  COUNTS:  YES  TOURNIQUET:  * No tourniquets in log *  DICTATION: .Other Dictation: Dictation Number [id number]  PLAN OF CARE: Admit for overnight observation  PATIENT DISPOSITION:  PACU - hemodynamically stable.   Delay start of Pharmacological VTE agent (>24hrs) due to surgical blood loss or risk of bleeding: not applicable

## 2022-08-03 NOTE — Progress Notes (Signed)
   08/03/22 0234  BiPAP/CPAP/SIPAP  $ Non-Invasive Home Ventilator  Initial  $ Face Mask Medium Yes  BiPAP/CPAP/SIPAP Pt Type Adult  BiPAP/CPAP/SIPAP DREAMSTATIOND  Mask Type Full face mask (per home regimen)  Mask Size Medium  Respiratory Rate 20 breaths/min  EPAP 14 cmH2O (home settings per pt)  FiO2 (%) 21 %  Patient Home Equipment No  Auto Titrate No  CPAP/SIPAP surface wiped down Yes  BiPAP/CPAP /SiPAP Vitals  Pulse Rate 62  Resp 20  SpO2 94 %

## 2022-08-03 NOTE — Anesthesia Postprocedure Evaluation (Signed)
Anesthesia Post Note  Patient: CHRISTOPHERJOSE JANOSIK  Procedure(s) Performed: CYSTOSCOPY WITH RETROGRADE PYELOGRAM/URETERAL STENT PLACEMENT (Right: Ureter)     Patient location during evaluation: PACU Anesthesia Type: General Level of consciousness: patient cooperative, oriented and sedated Pain management: pain level controlled Vital Signs Assessment: post-procedure vital signs reviewed and stable Respiratory status: spontaneous breathing, nonlabored ventilation, respiratory function stable and patient connected to nasal cannula oxygen Cardiovascular status: blood pressure returned to baseline and stable Postop Assessment: no apparent nausea or vomiting Anesthetic complications: no   No notable events documented.  Last Vitals:  Vitals:   08/03/22 1238 08/03/22 1419  BP: 110/67 137/69  Pulse: (!) 59   Resp: 16 16  Temp: 36.9 C 36.8 C  SpO2: 91% 98%    Last Pain:  Vitals:   08/03/22 1419  TempSrc:   PainSc: 0-No pain                 Maikol Grassia,E. Jesse Nosbisch

## 2022-08-03 NOTE — Anesthesia Preprocedure Evaluation (Addendum)
Anesthesia Evaluation  Patient identified by MRN, date of birth, ID band Patient awake    Reviewed: Allergy & Precautions, NPO status , Patient's Chart, lab work & pertinent test results, reviewed documented beta blocker date and time   History of Anesthesia Complications Negative for: history of anesthetic complications  Airway Mallampati: I  TM Distance: >3 FB Neck ROM: Full    Dental  (+) Edentulous Upper, Edentulous Lower   Pulmonary Continuous Positive Airway Pressure Ventilation neg sleep apnea, former smoker   breath sounds clear to auscultation       Cardiovascular hypertension, Pt. on medications and Pt. on home beta blockers (-) angina + CAD and + Past MI   Rhythm:Regular Rate:Normal     Neuro/Psych   Anxiety Depression    negative neurological ROS     GI/Hepatic Neg liver ROS,GERD  Medicated and Controlled,,N/v with this stone   Endo/Other  diabetes, Insulin DependentHypothyroidism  Ozempic Glu 111 BMI 40  Renal/GU Renal InsufficiencyRenal disease     Musculoskeletal  (+) Arthritis ,    Abdominal   Peds  Hematology plavix   Anesthesia Other Findings   Reproductive/Obstetrics                              Anesthesia Physical Anesthesia Plan  ASA: 3  Anesthesia Plan: General   Post-op Pain Management: Tylenol PO (pre-op)*   Induction: Intravenous and Rapid sequence  PONV Risk Score and Plan: 2 and Ondansetron, Dexamethasone and Treatment may vary due to age or medical condition  Airway Management Planned: Oral ETT  Additional Equipment: None  Intra-op Plan:   Post-operative Plan: Extubation in OR  Informed Consent: I have reviewed the patients History and Physical, chart, labs and discussed the procedure including the risks, benefits and alternatives for the proposed anesthesia with the patient or authorized representative who has indicated his/her understanding  and acceptance.       Plan Discussed with: CRNA and Surgeon  Anesthesia Plan Comments:          Anesthesia Quick Evaluation

## 2022-08-03 NOTE — Transfer of Care (Signed)
Immediate Anesthesia Transfer of Care Note  Patient: Douglas Aguirre  Procedure(s) Performed: CYSTOSCOPY WITH RETROGRADE PYELOGRAM/URETERAL STENT PLACEMENT (Right: Ureter)  Patient Location: PACU  Anesthesia Type:General  Level of Consciousness: awake and patient cooperative  Airway & Oxygen Therapy: Patient Spontanous Breathing and Patient connected to face mask  Post-op Assessment: Report given to RN and Post -op Vital signs reviewed and stable  Post vital signs: Reviewed and stable  Last Vitals:  Vitals Value Taken Time  BP 137/69 08/03/22 1418  Temp    Pulse 63 08/03/22 1421  Resp 13 08/03/22 1421  SpO2 91 % 08/03/22 1421  Vitals shown include unvalidated device data.  Last Pain:  Vitals:   08/03/22 1238  TempSrc: Oral  PainSc:       Patients Stated Pain Goal: 3 (08/03/22 1046)  Complications: No notable events documented.

## 2022-08-03 NOTE — Anesthesia Procedure Notes (Signed)
Procedure Name: Intubation Date/Time: 08/03/2022 1:50 PM  Performed by: Vanessa Algona, CRNAPre-anesthesia Checklist: Patient identified, Emergency Drugs available, Suction available and Patient being monitored Patient Re-evaluated:Patient Re-evaluated prior to induction Oxygen Delivery Method: Circle system utilized Preoxygenation: Pre-oxygenation with 100% oxygen Induction Type: IV induction, Rapid sequence and Cricoid Pressure applied Laryngoscope Size: 2 and Miller Grade View: Grade I Tube type: Oral Tube size: 7.5 mm Number of attempts: 1 Airway Equipment and Method: Stylet Placement Confirmation: ETT inserted through vocal cords under direct vision, positive ETCO2 and breath sounds checked- equal and bilateral Secured at: 22 cm Tube secured with: Tape Dental Injury: Teeth and Oropharynx as per pre-operative assessment

## 2022-08-03 NOTE — Progress Notes (Signed)
   08/03/22 2313  BiPAP/CPAP/SIPAP  BiPAP/CPAP/SIPAP Pt Type Adult  BiPAP/CPAP/SIPAP DREAMSTATIOND  Mask Type Full face mask  Mask Size Medium  Respiratory Rate 20 breaths/min  EPAP 14 cmH2O  FiO2 (%) 21 %  Patient Home Equipment No  Auto Titrate No  CPAP/SIPAP surface wiped down Yes  BiPAP/CPAP /SiPAP Vitals  Pulse Rate 65  Resp 20  SpO2 93 %

## 2022-08-03 NOTE — Progress Notes (Addendum)
PROGRESS NOTE  Douglas Aguirre ZOX:[id number] DOB: 08-Nov-1950 DOA: 08/02/2022 PCP: Moody Bruins, FNP   LOS: 0 days   Brief Narrative / Interim history: 72 year old male with history of CAD with prior stents 2023, obesity, DM 2, obesity, OSA on CPAP, prior kidney stones comes to the hospital with a right lower abdominal pain.  He also was having hematuria.  He presented to the ER and was found to have moderate right hydronephrosis and proximal hydroureter secondary to a 10 x 9 mm stone in the right proximal ureter.  Urology was consulted and he was admitted to the hospital  Subjective / 24h Interval events: Continues to have abdominal pain this morning.  No nausea or vomiting.  Assesement and Plan: Principal Problem:   Hydronephrosis with obstructing calculus Active Problems:   AKI (acute kidney injury) (HCC)   Hypertension   OSA on CPAP   Diabetes mellitus, type 2 (HCC)   Coronary artery disease   Obesity (BMI 30-39.9)   Nephrolithiasis   Principal problem Hydronephrosis, obstructing calculus -discussed with Dr. Berneice Heinrich this morning, appreciate input.  He will be taken to the OR this afternoon.  Active problems Acute kidney injury -due to #1, continue fluids, monitor renal function in the morning  Type 2 diabetes mellitus -allow diet after his procedure.  Hypoglycemic today due to n.p.o.  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  He would benefit from weight loss  Scheduled Meds:  [MAR Hold] aspirin EC  81 mg Oral q AM   [MAR Hold] carvedilol  12.5 mg Oral Q supper   [MAR Hold] carvedilol  6.25 mg Oral Q breakfast   chlorhexidine  15 mL Mouth/Throat Once   Or   mouth rinse  15 mL Mouth Rinse  Once   [MAR Hold] insulin aspart  0-15 Units Subcutaneous Q4H   [MAR Hold] levothyroxine  50 mcg Oral QAC breakfast   [MAR Hold] polyethylene glycol  17 g Oral Daily   [MAR Hold] ramipril  10 mg Oral q AM   [MAR Hold] rosuvastatin  40 mg Oral QHS   [MAR Hold] senna-docusate  2 tablet Oral QHS   [MAR Hold] sertraline  50 mg Oral q AM   [MAR Hold] tamsulosin  0.4 mg Oral q AM   [MAR Hold] traZODone  100 mg Oral QHS   Continuous Infusions:  lactated ringers     PRN Meds:.[MAR Hold] acetaminophen **OR** [MAR Hold] acetaminophen, [MAR Hold] dextrose, [MAR Hold]  HYDROmorphone (DILAUDID) injection, [MAR Hold] ondansetron **OR** [MAR Hold] ondansetron (ZOFRAN) IV, [MAR Hold] ondansetron, [MAR Hold] traZODone  Current Outpatient Medications  Medication Instructions   aspirin EC 81 mg, Oral, Every morning   Aspirin-Acetaminophen-Caffeine (GOODYS EXTRA STRENGTH) 500-325-65 MG PACK 1 packet, Oral, As needed   carvedilol (COREG) 6.25-12.5 mg, Oral, See admin instructions, Take 6.25 mg by mouth in the morning and 12.5 mg at bedtime   clopidogrel (PLAVIX) 75 mg, Oral, Daily at bedtime   fenofibrate micronized (LOFIBRA) 134 mg, Oral, Daily at bedtime   HYDROcodone-acetaminophen (NORCO) 10-325 MG tablet 1-2 tablets, Oral, Every 4 hours PRN   levocetirizine (XYZAL) 5 mg, Oral, Daily at bedtime   levothyroxine (SYNTHROID) 50 mcg, Oral, Daily before breakfast   Multiple Vitamin (MULTIVITAMIN WITH MINERALS) TABS tablet 1 tablet, Oral, Daily  with breakfast   nitroGLYCERIN (NITROSTAT) 0.4 mg, Sublingual, Every 5 min PRN   omeprazole (PRILOSEC) 40 mg, Oral, Daily before breakfast   ondansetron (ZOFRAN-ODT) 8 mg, Oral, Daily PRN   Ozempic (0.25 or 0.5 MG/DOSE) 0.5 mg, Subcutaneous, Every Sun   pioglitazone (ACTOS) 30 mg, Oral, Every morning   ramipril (ALTACE) 10 mg, Oral, Every morning   rosuvastatin (CRESTOR) 40 mg, Oral, Daily at bedtime   sertraline (ZOLOFT) 50 mg, Oral, Every morning   tamsulosin  (FLOMAX) 0.4 mg, Oral, Every morning   traZODone (DESYREL) 100 mg, Oral, Daily at bedtime   Tresiba FlexTouch 100 Units, Subcutaneous, Daily at bedtime   XIGDUO XR 06-998 MG TB24 1 tablet, Oral, 2 times daily    Diet Orders (From admission, onward)     Start     Ordered   08/03/22 0031  Diet NPO time specified Except for: Sips with Meds  Diet effective now       Question:  Except for  Answer:  Sips with Meds   08/03/22 0030            DVT prophylaxis: SCDs Start: 08/03/22 0031   Lab Results  Component Value Date   PLT 170 08/03/2022      Code Status: Full Code  Family Communication: no family at bedside   Status is: Inpatient Remains inpatient appropriate because: OR today  Level of care: Med-Surg  Consultants:  Urology   Objective: Vitals:   08/03/22 0234 08/03/22 0403 08/03/22 0857 08/03/22 1238  BP:  (!) 111/58 106/84 110/67  Pulse: 62 61 61 (!) 59  Resp: 20 20 16 16   Temp:  97.9 F (36.6 C) (!) 97.5 F (36.4 C) 98.4 F (36.9 C)  TempSrc:  Oral Oral Oral  SpO2: 94% 94% 92% 91%  Weight:      Height:        Intake/Output Summary (Last 24 hours) at 08/03/2022 1244 Last data filed at 08/03/2022 0600 Gross per 24 hour  Intake 450 ml  Output --  Net 450 ml   Wt Readings from Last 3 Encounters:  08/03/22 123.2 kg  08/10/16 114.9 kg  06/21/16 114 kg    Examination:  Constitutional: NAD Eyes: no scleral icterus ENMT: Mucous membranes are moist.  Neck: normal, supple Respiratory: clear to auscultation bilaterally, no wheezing, no crackles. Normal respiratory effort. No accessory muscle use.  Cardiovascular: Regular rate and rhythm, no murmurs / rubs / gallops. No LE edema.  Abdomen: non distended, no tenderness. Bowel sounds positive.  Musculoskeletal: no clubbing / cyanosis.   Data Reviewed: I have independently reviewed following labs and imaging studies   CBC Recent Labs  Lab 08/02/22 1418 08/03/22 0600  WBC 8.9 7.8  HGB 13.1 11.7*   HCT 39.3 37.0*  PLT 195 170  MCV 93.3 94.9  MCH 31.1 30.0  MCHC 33.3 31.6  RDW 14.6 14.6  LYMPHSABS  --  2.6  MONOABS  --  0.8  EOSABS  --  0.2  BASOSABS  --  0.0    Recent Labs  Lab 08/02/22 1418 08/03/22 0600  NA 136 136  K 4.2 3.7  CL 105 106  CO2 21* 23  GLUCOSE 82 88  BUN 23 21  CREATININE 1.47* 1.50*  CALCIUM 9.0 8.2*  AST 23 19  ALT 15 14  ALKPHOS 31* 25*  BILITOT 0.7 0.6  ALBUMIN 3.4* 3.2*  HGBA1C  --  6.0*    ------------------------------------------------------------------------------------------------------------------ No results for input(s): "CHOL", "HDL", "LDLCALC", "TRIG", "  CHOLHDL", "LDLDIRECT" in the last 72 hours.  Lab Results  Component Value Date   HGBA1C 6.0 (H) 08/03/2022   ------------------------------------------------------------------------------------------------------------------ No results for input(s): "TSH", "T4TOTAL", "T3FREE", "THYROIDAB" in the last 72 hours.  Invalid input(s): "FREET3"  Cardiac Enzymes No results for input(s): "CKMB", "TROPONINI", "MYOGLOBIN" in the last 168 hours.  Invalid input(s): "CK" ------------------------------------------------------------------------------------------------------------------ No results found for: "BNP"  CBG: Recent Labs  Lab 08/02/22 2208 08/03/22 0357 08/03/22 0722 08/03/22 1146 08/03/22 1217  GLUCAP 118* 109* 86 66* 111*    No results found for this or any previous visit (from the past 240 hour(s)).   Radiology Studies: CT ABDOMEN PELVIS W CONTRAST  Result Date: 08/02/2022 CLINICAL DATA:  Abdomen flank pain EXAM: CT ABDOMEN AND PELVIS WITH CONTRAST TECHNIQUE: Multidetector CT imaging of the abdomen and pelvis was performed using the standard protocol following bolus administration of intravenous contrast. RADIATION DOSE REDUCTION: This exam was performed according to the departmental dose-optimization program which includes automated exposure control, adjustment of  the mA and/or kV according to patient size and/or use of iterative reconstruction technique. CONTRAST:  75mL OMNIPAQUE IOHEXOL 350 MG/ML SOLN COMPARISON:  None Available. FINDINGS: Lower chest: Lung bases demonstrate no acute airspace disease. Cardiomegaly. Coronary vascular calcification. Hepatobiliary: No focal liver abnormality is seen. No gallstones, gallbladder wall thickening, or biliary dilatation. Pancreas: Unremarkable. No pancreatic ductal dilatation or surrounding inflammatory changes. Spleen: Normal in size without focal abnormality. Adrenals/Urinary Tract: Adrenal glands are normal. Mild to moderate right hydronephrosis and proximal hydroureter, secondary to a 10 x 9 mm stone in the proximal right ureter about 2.5 cm distal to the right UPJ. There is an additional punctate 2 mm stone slightly cranial to the larger stone. Moderate right perinephric fat stranding. Delayed excretion of contrast from right kidney consistent with obstruction. Bladder is unremarkable. Stomach/Bowel: Stomach is within normal limits. Appendix appears normal. No evidence of bowel wall thickening, distention, or inflammatory changes. Vascular/Lymphatic: Moderate aortic atherosclerosis. No aneurysm. No suspicious lymph nodes Reproductive: Enlarged prostate Other: Negative for pelvic effusion or free air. Small fat containing inguinal hernias. Negative for abdominal wall hernia other than small fat containing umbilical hernia Musculoskeletal: No acute osseous abnormality. IMPRESSION: 1. Mild to moderate right hydronephrosis and proximal hydroureter secondary to a 10 x 9 mm stone in the proximal right ureter about 2.5 cm distal to the right UPJ. There is an additional punctate 2 mm stone slightly cranial to the larger stone. Moderate right perinephric fat stranding. 2. Enlarged prostate. 3. Aortic atherosclerosis. Aortic Atherosclerosis (ICD10-I70.0). Electronically Signed   By: Jasmine Pang M.D.   On: 08/02/2022 18:49      Pamella Pert, MD, PhD [facility]  Between 7 am - 7 pm I am available, please contact me via Amion (for emergencies) or Securechat (non urgent messages)  Between 7 pm - 7 am I am not available, please contact night coverage MD/APP via Amion

## 2022-08-04 ENCOUNTER — Encounter (HOSPITAL_COMMUNITY): Payer: Self-pay | Admitting: Urology

## 2022-08-04 DIAGNOSIS — N132 Hydronephrosis with renal and ureteral calculous obstruction: Secondary | ICD-10-CM | POA: Diagnosis not present

## 2022-08-04 LAB — COMPREHENSIVE METABOLIC PANEL
ALT: 14 U/L (ref 0–44)
AST: 23 U/L (ref 15–41)
Albumin: 3 g/dL — ABNORMAL LOW (ref 3.5–5.0)
Alkaline Phosphatase: 27 U/L — ABNORMAL LOW (ref 38–126)
Anion gap: 5 (ref 5–15)
BUN: 17 mg/dL (ref 8–23)
CO2: 25 mmol/L (ref 22–32)
Calcium: 8.4 mg/dL — ABNORMAL LOW (ref 8.9–10.3)
Chloride: 106 mmol/L (ref 98–111)
Creatinine, Ser: 1.21 mg/dL (ref 0.61–1.24)
GFR, Estimated: >60 mL/min (ref >60)
Glucose, Bld: 115 mg/dL — ABNORMAL HIGH (ref 70–99)
Potassium: 3.7 mmol/L (ref 3.5–5.1)
Sodium: 136 mmol/L (ref 135–145)
Total Bilirubin: 0.6 mg/dL (ref 0.3–1.2)
Total Protein: 5.8 g/dL — ABNORMAL LOW (ref 6.5–8.1)

## 2022-08-04 LAB — GLUCOSE, CAPILLARY
Glucose-Capillary: 109 mg/dL — ABNORMAL HIGH (ref 70–99)
Glucose-Capillary: 116 mg/dL — ABNORMAL HIGH (ref 70–99)

## 2022-08-04 LAB — CBC
HCT: 36.4 % — ABNORMAL LOW (ref 39.0–52.0)
Hemoglobin: 11.7 g/dL — ABNORMAL LOW (ref 13.0–17.0)
MCH: 30.5 pg (ref 26.0–34.0)
MCHC: 32.1 g/dL (ref 30.0–36.0)
MCV: 94.8 fL (ref 80.0–100.0)
Platelets: 175 10*3/uL (ref 150–400)
RBC: 3.84 MIL/uL — ABNORMAL LOW (ref 4.22–5.81)
RDW: 14.6 % (ref 11.5–15.5)
WBC: 6.2 10*3/uL (ref 4.0–10.5)
nRBC: 0 % (ref 0.0–0.2)

## 2022-08-04 LAB — MAGNESIUM: Magnesium: 1.8 mg/dL (ref 1.7–2.4)

## 2022-08-04 NOTE — Progress Notes (Signed)
Patient provided with discharge education, patient verbalized understanding. IV removed.  

## 2022-08-04 NOTE — Discharge Summary (Signed)
Physician Discharge Summary  NACHO DICE XWR:[id number] DOB: [birth year] DOA: 08/02/2022  PCP: Moody Bruins, FNP  Admit date: 08/02/2022 Discharge date: 08/04/2022  Admitted From: home Disposition:  home  Recommendations for Outpatient Follow-up:  Follow up with PCP in 1-2 weeks Follow up with Urology as scheduled  Home Health: none Equipment/Devices: none  Discharge Condition: stable CODE STATUS: Full code Diet Orders (From admission, onward)     Start     Ordered   08/03/22 1411  Diet Carb Modified Fluid consistency: Thin; Room service appropriate? Yes  Diet effective now       Question Answer Comment  Diet-HS Snack? Nothing   Calorie Level Medium 1600-2000   Fluid consistency: Thin   Room service appropriate? Yes      08/03/22 1410            HPI: Per admitting MD, 72 year old white male history of hypertension, type 2 diabetes, coronary disease status post drug-eluting stent 2023,, morbid obesity BMI 39, OSA on CPAP, prior history of kidney stones presents the ER today with a 2-day history of abdominal pain/right lower quadrant pain.  Patient states that he has had chronic back pain for several years.  Thought that this was an exacerbation of his back pain.  Patient was bring his wife down for medical office appointment today when he noted that the pain started to increase.  Patient was dropped off at the ER.  Patient noted that he started urinating blood yesterday.  This was intermittent. Denies any fever or chills.  Had some nausea but no vomiting.  Hospital Course / Discharge diagnoses: Principal Problem:   Hydronephrosis with obstructing calculus Active Problems:   AKI (acute kidney injury) (HCC)   Hypertension   OSA on CPAP   Diabetes mellitus, type 2 (HCC)   Coronary artery disease   Obesity (BMI 30-39.9)   Nephrolithiasis   Ureteral stone with hydronephrosis   Principal problem Hydronephrosis, obstructing calculus -urology was consulted  and followed patient while hospitalized.  He was taken urgently to the OR on 6/19 and is status post right retrograde pyelogram, cystoscopy as well as placement of a right ureteral stent.  Following that, patient improved, his abdominal pain resolved.  Of note, patient did not have any evidence of an infectious process, he had no fever, leukocytosis and he was not maintained on antibiotics.  Following clinical improvement, he will be discharged home in stable condition and will have close outpatient follow-up with urology for definitive treatment in the next few weeks.  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  He would benefit from weight loss    Sepsis ruled out   Discharge Instructions   Allergies as of 08/04/2022       Reactions   Alpha-gal Other (See Comments)   Niacin Other (See Comments)   Redness and burning        Medication List     TAKE these medications    aspirin EC 81 MG tablet Take 81 mg by mouth in the morning.   carvedilol 6.25 MG tablet Commonly known as: COREG Take 6.25-12.5 mg by mouth See admin instructions. Take 6.25 mg by mouth in the morning and 12.5 mg at bedtime   clopidogrel 75 MG  tablet Commonly known as: PLAVIX Take 75 mg by mouth at bedtime.   fenofibrate micronized 134 MG capsule Commonly known as: LOFIBRA Take 134 mg by mouth at bedtime.   Goodys Extra Strength [id number] MG Pack Generic drug: Aspirin-Acetaminophen-Caffeine Take 1 packet by mouth as needed (for headaches).   HYDROcodone-acetaminophen 10-325 MG tablet Commonly known as: NORCO Take 1-2 tablets by mouth every 4 (four) hours as needed for severe  pain. What changed:  how much to take when to take this additional instructions   levocetirizine 5 MG tablet Commonly known as: XYZAL Take 5 mg by mouth at bedtime.   levothyroxine 50 MCG tablet Commonly known as: SYNTHROID Take 50 mcg by mouth daily before breakfast.   multivitamin with minerals Tabs tablet Take 1 tablet by mouth daily with breakfast.   nitroGLYCERIN 0.4 MG SL tablet Commonly known as: NITROSTAT Place 0.4 mg under the tongue every 5 (five) minutes as needed for chest pain.   omeprazole 40 MG capsule Commonly known as: PRILOSEC Take 40 mg by mouth daily before breakfast.   ondansetron 4 MG disintegrating tablet Commonly known as: ZOFRAN-ODT Take 8 mg by mouth daily as needed for nausea or vomiting.   Ozempic (0.25 or 0.5 MG/DOSE) 2 MG/3ML Sopn Generic drug: Semaglutide(0.25 or 0.5MG /DOS) Inject 0.5 mg into the skin every Sunday.   pioglitazone 30 MG tablet Commonly known as: ACTOS Take 30 mg by mouth in the morning.   ramipril 10 MG capsule Commonly known as: ALTACE Take 10 mg by mouth in the morning.   rosuvastatin 40 MG tablet Commonly known as: CRESTOR Take 40 mg by mouth at bedtime.   sertraline 50 MG tablet Commonly known as: ZOLOFT Take 50 mg by mouth in the morning.   tamsulosin 0.4 MG Caps capsule Commonly known as: FLOMAX Take 0.4 mg by mouth in the morning.   traZODone 100 MG tablet Commonly known as: DESYREL Take 100 mg by mouth at bedtime.   Evaristo Bury FlexTouch 200 UNIT/ML FlexTouch Pen Generic drug: insulin degludec Inject 100 Units into the skin at bedtime.   Xigduo XR 06-998 MG Tb24 Generic drug: Dapagliflozin Pro-metFORMIN ER Take 1 tablet by mouth in the morning and at bedtime.        Follow-up Information     Manny, Delbert Phenix., MD Follow up.   Specialty: Urology Why: Office wil call to schedule outpatient ureteroscopy in near future. Contact information: [address] [place]  [address] [phone]                 Consultations: Urology   Procedures/Studies:  DG C-Arm 1-60 Min-No Report  Result Date: 08/03/2022 Fluoroscopy was utilized by the requesting physician.  No radiographic interpretation.   CT ABDOMEN PELVIS W CONTRAST  Result Date: 08/02/2022 CLINICAL DATA:  Abdomen flank pain EXAM: CT ABDOMEN AND PELVIS WITH CONTRAST TECHNIQUE: Multidetector CT imaging of the abdomen and pelvis was performed using the standard protocol following bolus administration of intravenous contrast. RADIATION DOSE REDUCTION: This exam was performed according to the departmental dose-optimization program which includes automated exposure control, adjustment of the mA and/or kV according to patient size and/or use of iterative reconstruction technique. CONTRAST:  75mL OMNIPAQUE IOHEXOL 350 MG/ML SOLN COMPARISON:  None Available. FINDINGS: Lower chest: Lung bases demonstrate no acute airspace disease. Cardiomegaly. Coronary vascular calcification. Hepatobiliary: No focal liver abnormality is seen. No gallstones, gallbladder wall thickening, or biliary dilatation. Pancreas: Unremarkable. No pancreatic ductal dilatation or surrounding inflammatory changes. Spleen: Normal in size  without focal abnormality. Adrenals/Urinary Tract: Adrenal glands are normal. Mild to moderate right hydronephrosis and proximal hydroureter, secondary to a 10 x 9 mm stone in the proximal right ureter about 2.5 cm distal to the right UPJ. There is an additional punctate 2 mm stone slightly cranial to the larger stone. Moderate right perinephric fat stranding. Delayed excretion of contrast from right kidney consistent with obstruction. Bladder is unremarkable. Stomach/Bowel: Stomach is within normal limits. Appendix appears normal. No evidence of bowel wall thickening, distention, or inflammatory changes. Vascular/Lymphatic: Moderate aortic atherosclerosis. No aneurysm. No suspicious lymph nodes Reproductive:  Enlarged prostate Other: Negative for pelvic effusion or free air. Small fat containing inguinal hernias. Negative for abdominal wall hernia other than small fat containing umbilical hernia Musculoskeletal: No acute osseous abnormality. IMPRESSION: 1. Mild to moderate right hydronephrosis and proximal hydroureter secondary to a 10 x 9 mm stone in the proximal right ureter about 2.5 cm distal to the right UPJ. There is an additional punctate 2 mm stone slightly cranial to the larger stone. Moderate right perinephric fat stranding. 2. Enlarged prostate. 3. Aortic atherosclerosis. Aortic Atherosclerosis (ICD10-I70.0). Electronically Signed   By: Jasmine Pang M.D.   On: 08/02/2022 18:49     Subjective: - no chest pain, shortness of breath, no abdominal pain, nausea or vomiting.   Discharge Exam: BP 111/65 (BP Location: Left Arm)   Pulse (!) 59   Temp 98.2 F (36.8 C) (Oral)   Resp 20   Ht 5\' 9"  (1.753 m)   Wt 123.4 kg   SpO2 95%   BMI 40.17 kg/m   General: Pt is alert, awake, not in acute distress Cardiovascular: RRR, S1/S2 +, no rubs, no gallops Respiratory: CTA bilaterally, no wheezing, no rhonchi Abdominal: Soft, NT, ND, bowel sounds + Extremities: no edema, no cyanosis    The results of significant diagnostics from this hospitalization (including imaging, microbiology, ancillary and laboratory) are listed below for reference.     Microbiology: No results found for this or any previous visit (from the past 240 hour(s)).   Labs: Basic Metabolic Panel: Recent Labs  Lab 08/02/22 1418 08/03/22 0600 08/04/22 0536  NA 136 136 136  K 4.2 3.7 3.7  CL 105 106 106  CO2 21* 23 25  GLUCOSE 82 88 115*  BUN 23 21 17   CREATININE 1.47* 1.50* 1.21  CALCIUM 9.0 8.2* 8.4*  MG  --   --  1.8   Liver Function Tests: Recent Labs  Lab 08/02/22 1418 08/03/22 0600 08/04/22 0536  AST 23 19 23   ALT 15 14 14   ALKPHOS 31* 25* 27*  BILITOT 0.7 0.6 0.6  PROT 6.4* 5.9* 5.8*  ALBUMIN 3.4*  3.2* 3.0*   CBC: Recent Labs  Lab 08/02/22 1418 08/03/22 0600 08/04/22 0536  WBC 8.9 7.8 6.2  NEUTROABS  --  4.2  --   HGB 13.1 11.7* 11.7*  HCT 39.3 37.0* 36.4*  MCV 93.3 94.9 94.8  PLT 195 170 175   CBG: Recent Labs  Lab 08/03/22 1624 08/03/22 1917 08/03/22 2350 08/04/22 0451 08/04/22 0805  GLUCAP 117* 183* 140* 109* 116*   Hgb A1c Recent Labs    08/03/22 0600  HGBA1C 6.0*   Lipid Profile No results for input(s): "CHOL", "HDL", "LDLCALC", "TRIG", "CHOLHDL", "LDLDIRECT" in the last 72 hours. Thyroid function studies No results for input(s): "TSH", "T4TOTAL", "T3FREE", "THYROIDAB" in the last 72 hours.  Invalid input(s): "FREET3" Urinalysis    Component Value Date/Time   COLORURINE YELLOW 08/02/2022 1404  APPEARANCEUR CLEAR 08/02/2022 1404   LABSPEC 1.013 08/02/2022 1404   PHURINE 6.0 08/02/2022 1404   GLUCOSEU >=500 (A) 08/02/2022 1404   HGBUR NEGATIVE 08/02/2022 1404   BILIRUBINUR NEGATIVE 08/02/2022 1404   KETONESUR NEGATIVE 08/02/2022 1404   PROTEINUR NEGATIVE 08/02/2022 1404   NITRITE NEGATIVE 08/02/2022 1404   LEUKOCYTESUR NEGATIVE 08/02/2022 1404    FURTHER DISCHARGE INSTRUCTIONS:   Get Medicines reviewed and adjusted: Please take all your medications with you for your next visit with your Primary MD   Laboratory/radiological data: Please request your Primary MD to go over all hospital tests and procedure/radiological results at the follow up, please ask your Primary MD to get all Hospital records sent to his/her office.   In some cases, they will be blood work, cultures and biopsy results pending at the time of your discharge. Please request that your primary care M.D. goes through all the records of your hospital data and follows up on these results.   Also Note the following: If you experience worsening of your admission symptoms, develop shortness of breath, life threatening emergency, suicidal or homicidal thoughts you must seek medical  attention immediately by calling 911 or calling your MD immediately  if symptoms less severe.   You must read complete instructions/literature along with all the possible adverse reactions/side effects for all the Medicines you take and that have been prescribed to you. Take any new Medicines after you have completely understood and accpet all the possible adverse reactions/side effects.    Do not drive when taking Pain medications or sleeping medications (Benzodaizepines)   Do not take more than prescribed Pain, Sleep and Anxiety Medications. It is not advisable to combine anxiety,sleep and pain medications without talking with your primary care practitioner   Special Instructions: If you have smoked or chewed Tobacco  in the last 2 yrs please stop smoking, stop any regular Alcohol  and or any Recreational drug use.   Wear Seat belts while driving.   Please note: You were cared for by a hospitalist during your hospital stay. Once you are discharged, your primary care physician will handle any further medical issues. Please note that NO REFILLS for any discharge medications will be authorized once you are discharged, as it is imperative that you return to your primary care physician (or establish a relationship with a primary care physician if you do not have one) for your post hospital discharge needs so that they can reassess your need for medications and monitor your lab values.  Time coordinating discharge: 35 minutes  SIGNED:  Pamella Pert, MD, PhD 08/04/2022, 9:39 AM

## 2022-08-04 NOTE — TOC CM/SW Note (Signed)
Transition of Care [facility]) - Inpatient Brief Assessment   Patient Details  Name: Douglas Aguirre MRN: [id number] Date of Birth: [birth year]  Transition of Care [facility] CM/SW Contact:    Otelia Santee, LCSW Phone Number: 08/04/2022, 9:47 AM   Clinical Narrative: Chart reviewed. No TOC needs identified.    Transition of Care Asessment: Insurance and Status: Insurance coverage has been reviewed Patient has primary care physician: Yes Home environment has been reviewed: Home w/ spouse Prior level of function:: Independent Prior/Current Home Services: No current home services Social Determinants of Health Reivew: SDOH reviewed no interventions necessary Readmission risk has been reviewed: Yes Transition of care needs: no transition of care needs at this time

## 2022-08-05 ENCOUNTER — Other Ambulatory Visit: Payer: Self-pay | Admitting: Urology

## 2022-08-08 NOTE — Patient Instructions (Addendum)
DUE TO COVID-19 ONLY TWO VISITORS  (aged 70 and older)  ARE ALLOWED TO COME WITH YOU AND STAY IN THE WAITING ROOM ONLY DURING PRE OP AND PROCEDURE.   **NO VISITORS ARE ALLOWED IN THE SHORT STAY AREA OR RECOVERY ROOM!!**  IF YOU WILL BE ADMITTED INTO THE HOSPITAL YOU ARE ALLOWED ONLY FOUR SUPPORT PEOPLE DURING VISITATION HOURS ONLY (7 AM -8PM)   The support person(s) must pass our screening, gel in and out, and wear a mask at all times, including in the patient's room. Patients must also wear a mask when staff or their support person are in the room. Visitors GUEST BADGE MUST BE WORN VISIBLY  One adult visitor may remain with you overnight and MUST be in the room by 8 P.M.     Your procedure is scheduled on: 08/17/22   Report to [facility] Main Entrance    Report to admitting at : 8:00 AM   Call this number if you have problems the morning of surgery [phone]   Do not eat food :After Midnight.   Oral Hygiene is also important to reduce your risk of infection.                                    Remember - BRUSH YOUR TEETH THE MORNING OF SURGERY WITH YOUR REGULAR TOOTHPASTE  DENTURES WILL BE REMOVED PRIOR TO SURGERY PLEASE DO NOT APPLY "Poly grip" OR ADHESIVES!!!   Do NOT smoke after Midnight   Take these medicines the morning of surgery with A SIP OF WATER: sertraline,carvedilol,tamsulosin,levothyroxine,omeprazole.  How to Manage Your Diabetes Before and After Surgery  Why is it important to control my blood sugar before and after surgery? Improving blood sugar levels before and after surgery helps healing and can limit problems. A way of improving blood sugar control is eating a healthy diet by:  Eating less sugar and carbohydrates  Increasing activity/exercise  Talking with your doctor about reaching your blood sugar goals High blood sugars (greater than 180 mg/dL) can raise your risk of infections and slow your recovery, so you will need to focus on controlling your  diabetes during the weeks before surgery. Make sure that the doctor who takes care of your diabetes knows about your planned surgery including the date and location.  How do I manage my blood sugar before surgery? Check your blood sugar at least 4 times a day, starting 2 days before surgery, to make sure that the level is not too high or low. Check your blood sugar the morning of your surgery when you wake up and every 2 hours until you get to the Short Stay unit. If your blood sugar is less than 70 mg/dL, you will need to treat for low blood sugar: Do not take insulin. Treat a low blood sugar (less than 70 mg/dL) with  cup of clear juice (cranberry or apple), 4 glucose tablets, OR glucose gel. Recheck blood sugar in 15 minutes after treatment (to make sure it is greater than 70 mg/dL). If your blood sugar is not greater than 70 mg/dL on recheck, call [phone] for further instructions. Report your blood sugar to the short stay nurse when you get to Short Stay.  If you are admitted to the hospital after surgery: Your blood sugar will be checked by the staff and you will probably be given insulin after surgery (instead of oral diabetes medicines) to make  sure you have good blood sugar levels. The goal for blood sugar control after surgery is 80-180 mg/dL.   WHAT DO I DO ABOUT MY DIABETES MEDICATION?  HOLD xigduo 72 hours ( 3 days ) before surgery. Last dose: 08/13/22  THE NIGHT BEFORE SURGERY, take ONLY half of the tresiba insulin dose (50 units) .      THE MORNING OF SURGERY, DO NOT TAKE ANY ORAL DIABETIC MEDICATIONS DAY OF YOUR SURGERY  DO NOT TAKE THE FOLLOWING 7 DAYS PRIOR TO SURGERY: Ozempic, Wegovy, Rybelsus (Semaglutide), Byetta (exenatide), Bydureon (exenatide ER), Victoza, Saxenda (liraglutide), or Trulicity (dulaglutide) Mounjaro (Tirzepatide) Adlyxin (Lixisenatide), Polyethylene Glycol Loxenatide. Last dose of Ozempic: 08/07/22  Bring CPAP mask and tubing day of surgery.                               You may not have any metal on your body including hair pins, jewelry, and body piercing             Do not wear lotions, powders, perfumes/cologne, or deodorant              Men may shave face and neck.   Do not bring valuables to the hospital. [place] IS NOT             RESPONSIBLE   FOR VALUABLES.   Contacts, glasses, or bridgework may not be worn into surgery.   Bring small overnight bag day of surgery.   DO NOT BRING YOUR HOME MEDICATIONS TO THE HOSPITAL. PHARMACY WILL DISPENSE MEDICATIONS LISTED ON YOUR MEDICATION LIST TO YOU DURING YOUR ADMISSION IN THE HOSPITAL!    Patients discharged on the day of surgery will not be allowed to drive home.  Someone NEEDS to stay with you for the first 24 hours after anesthesia.   Special Instructions: Bring a copy of your healthcare power of attorney and living will documents         the day of surgery if you haven't scanned them before.              Please read over the following fact sheets you were given: IF YOU HAVE QUESTIONS ABOUT YOUR PRE-OP INSTRUCTIONS PLEASE CALL [phone]    [facility] - Preparing for Surgery Before surgery, you can play an important role.  Because skin is not sterile, your skin needs to be as free of germs as possible.  You can reduce the number of germs on your skin by washing with CHG (chlorahexidine gluconate) soap before surgery.  CHG is an antiseptic cleaner which kills germs and bonds with the skin to continue killing germs even after washing. Please DO NOT use if you have an allergy to CHG or antibacterial soaps.  If your skin becomes reddened/irritated stop using the CHG and inform your nurse when you arrive at Short Stay. Do not shave (including legs and underarms) for at least 48 hours prior to the first CHG shower.  You may shave your face/neck. Please follow these instructions carefully:  1.  Shower with CHG Soap the night before surgery and the  morning of Surgery.  2.  If  you choose to wash your hair, wash your hair first as usual with your  normal  shampoo.  3.  After you shampoo, rinse your hair and body thoroughly to remove the  shampoo.  4.  Use CHG as you would any other liquid soap.  You can apply chg directly  to the skin and wash                       Gently with a scrungie or clean washcloth.  5.  Apply the CHG Soap to your body ONLY FROM THE NECK DOWN.   Do not use on face/ open                           Wound or open sores. Avoid contact with eyes, ears mouth and genitals (private parts).                       Wash face,  Genitals (private parts) with your normal soap.             6.  Wash thoroughly, paying special attention to the area where your surgery  will be performed.  7.  Thoroughly rinse your body with warm water from the neck down.  8.  DO NOT shower/wash with your normal soap after using and rinsing off  the CHG Soap.                9.  Pat yourself dry with a clean towel.            10.  Wear clean pajamas.            11.  Place clean sheets on your bed the night of your first shower and do not  sleep with pets. Day of Surgery : Do not apply any lotions/deodorants the morning of surgery.  Please wear clean clothes to the hospital/surgery center.  FAILURE TO FOLLOW THESE INSTRUCTIONS MAY RESULT IN THE CANCELLATION OF YOUR SURGERY PATIENT SIGNATURE_________________________________  NURSE SIGNATURE__________________________________  ________________________________________________________________________

## 2022-08-09 ENCOUNTER — Encounter (HOSPITAL_COMMUNITY)
Admission: RE | Admit: 2022-08-09 | Discharge: 2022-08-09 | Disposition: A | Discharge status: Home / Self Care | Payer: Medicare Other | Source: Ambulatory Visit | Attending: Urology | Admitting: Urology

## 2022-08-09 ENCOUNTER — Other Ambulatory Visit: Payer: Self-pay

## 2022-08-09 ENCOUNTER — Encounter (HOSPITAL_COMMUNITY): Payer: Self-pay

## 2022-08-09 VITALS — Ht 69.0 in | Wt 260.0 lb

## 2022-08-09 DIAGNOSIS — E1169 Type 2 diabetes mellitus with other specified complication: Secondary | ICD-10-CM

## 2022-08-09 DIAGNOSIS — I10 Essential (primary) hypertension: Secondary | ICD-10-CM

## 2022-08-09 HISTORY — DX: Malignant (primary) neoplasm, unspecified: C80.1

## 2022-08-09 NOTE — Progress Notes (Signed)
For Short Stay: COVID SWAB appointment date:  Bowel Prep reminder:   For Anesthesia: PCP - Summer August Olinger: FNP. Cardiologist - Dr. Daryel November. Clearance: Dr. Kandyce Rud: 08/05/22: Chart.  Chest x-ray -  EKG - 08/08/22: chart Stress Test -  ECHO - 06/09/22 Cardiac Cath - 2023 Pacemaker/ICD device last checked: Pacemaker orders received: Device Rep notified:  Spinal Cord Stimulator: N/A  Sleep Study - Yes CPAP - Yes  Fasting Blood Sugar - 100's Checks Blood Sugar ___1__ times a day Date and result of last Hgb A1c-6.0: 08/03/22  Last dose of GLP1 agonist- Ozempic: 07/31/22 GLP1 instructions:   Last dose of SGLT-2 inhibitors- Xigdu: last dose: 08/13/22 SGLT-2 instructions:   Blood Thinner Instructions: As per pt. He does not need to hold plavix or aspirin. Aspirin Instructions: Last Dose:  Activity level: Can go up a flight of stairs and activities of daily living without stopping and without chest pain and/or shortness of breath   Able to exercise without chest pain and/or shortness of breath  Anesthesia review: Hx: HTN,OSA(CPAP),CAD,MI  Patient denies shortness of breath, fever, cough and chest pain at PAT appointment   Patient verbalized understanding of instructions that were given to them at the PAT appointment. Patient was also instructed that they will need to review over the PAT instructions again at home before surgery.

## 2022-08-10 NOTE — Anesthesia Preprocedure Evaluation (Addendum)
Anesthesia Evaluation  Patient identified by MRN, date of birth, ID band Patient awake    Reviewed: Allergy & Precautions, NPO status , Patient's Chart, lab work & pertinent test results, reviewed documented beta blocker date and time   History of Anesthesia Complications Negative for: history of anesthetic complications  Airway Mallampati: I  TM Distance: >3 FB Neck ROM: Full    Dental  (+) Edentulous Upper, Edentulous Lower   Pulmonary Continuous Positive Airway Pressure Ventilation neg sleep apnea, former smoker   breath sounds clear to auscultation       Cardiovascular hypertension, Pt. on medications and Pt. on home beta blockers (-) angina + CAD and + Past MI   Rhythm:Regular Rate:Normal     Neuro/Psych   Anxiety Depression    negative neurological ROS     GI/Hepatic Neg liver ROS,GERD  Medicated and Controlled,,  Endo/Other  diabetes, Insulin DependentHypothyroidism    Renal/GU Renal InsufficiencyRenal disease     Musculoskeletal  (+) Arthritis ,    Abdominal   Peds  Hematology plavix   Anesthesia Other Findings   Reproductive/Obstetrics                             Anesthesia Physical Anesthesia Plan  ASA: 3  Anesthesia Plan: General   Post-op Pain Management: Tylenol PO (pre-op)*   Induction: Intravenous  PONV Risk Score and Plan: 2 and Ondansetron, Dexamethasone and Treatment may vary due to age or medical condition  Airway Management Planned: LMA  Additional Equipment: None  Intra-op Plan:   Post-operative Plan: Extubation in OR  Informed Consent: I have reviewed the patients History and Physical, chart, labs and discussed the procedure including the risks, benefits and alternatives for the proposed anesthesia with the patient or authorized representative who has indicated his/her understanding and acceptance.     Dental advisory given  Plan Discussed with:  CRNA  Anesthesia Plan Comments:         Anesthesia Quick Evaluation

## 2022-08-10 NOTE — Progress Notes (Signed)
Anesthesia chart review   Case: [id number] Date/Time: 08/17/22 1000   Procedures:      CYSTOSCOPY WITH RETROGRADE PYELOGRAM, URETEROSCOPY AND STENT EXCHANGE (Right) - 90 MINS     HOLMIUM LASER APPLICATION (Right)   Anesthesia type: General   Pre-op diagnosis: RIGHT URETERAL STONE   Location[facility] ROOM 03 / [facility]   Surgeons: Loletta Parish., MD       DISCUSSION: 72 year old former smoker with history of HTN, GERD, diabetes type 2, OSA on CPAP, CAD, right ureteral stone scheduled for above procedure 08/17/2022 with Dr. Sebastian Ache.  Patient with recent admission 6/18 to 08/04/2022 with hydronephrosis with obstructing calculus.  Status post cystoscopy during admission.  No anesthesia complications noted.  Clearance from cardiology received 08/08/2022 which states, "the patient is optimized from a cardiac perspective to undergo cystoscopy, ureteroscopy, and ureteral stent exchange.  He can continue ASA and Plavix per urology.  No further cardiac evaluation is indicated prior to procedure." VS: Ht 5\' 9"  (1.753 m)   Wt 117.9 kg   BMI 38.40 kg/m   PROVIDERS: Olinger, August Summer, FNP is PCP  Dory Peru, MD is Cardiologist  LABS: Labs reviewed: Acceptable for surgery. (all labs ordered are listed, but only abnormal results are displayed)  Labs Reviewed - No data to display   IMAGES:   EKG: EKG on chart  CV: Echo 06/10/2022 Normal left ventricular systolic function.  LVEF 60 to 65%. Normal right ventricular function and TAPSE normal. Moderate left ventricular hypertrophy. Normal diastolic function. Cardiac chamber imaging demonstrates moderate right ventricular enlargement, and mild to moderate biatrial enlargement. Valvular Doppler imaging demonstrates mild mitral regurgitation and mild tricuspid regurgitation with Doppler evidence of mild pulmonary hypertension. Mildly elevated estimated PA systolic pressure. Small pericardial effusion. 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   Aortic root dilatation noted.  IVC dilated. Past Medical History:  Diagnosis Date   Anxiety    Arthritis    degenerative joints, back- upper & lower    Cancer (HCC)    Coronary artery disease    Depression    Diabetes mellitus without complication (HCC)    Diabetes mellitus, type 2 (HCC)    GERD (gastroesophageal reflux disease)    Herniated cervical disc 06/21/2016   History of hiatal hernia    History of kidney stones    passed spontaneously, & has cystoscopy & lithotripsy   Hypertension    Hypothyroidism    Myocardial infarction [facility]    Neuromuscular disorder (HCC)    carpal tunnel syndrome - both hands    OSA on CPAP    Sleep apnea 2013   last study- cpap    Past Surgical History:  Procedure Laterality Date   ANTERIOR CERVICAL DECOMP/DISCECTOMY FUSION N/A 06/21/2016   Procedure: Cervical Six-Seven Anterior cervical decompression/discectomy/fusion; LEFT CARPAL TUNNEL RELEASE;  Surgeon: Maeola Harman, MD;  Location: [facility] OR;  Service: Neurosurgery;  Laterality: N/A;  left side approach   CARPAL TUNNEL RELEASE Right 2017   CARPAL TUNNEL RELEASE Left 06/21/2016   Procedure: LEFT CARPAL TUNNEL RELEASE;  Surgeon: Maeola Harman, MD;  Location: [facility] OR;  Service: Neurosurgery;  Laterality: Left;  LEFT    CARPAL TUNNEL RELEASE Right 08/19/2016   Procedure: RIGHT CARPAL TUNNEL RELEASE;  Surgeon: Maeola Harman, MD;  Location: [facility] OR;  Service: Neurosurgery;  Laterality: Right;  RIGHT CARPAL TUNNEL RELEASE   CORONARY ANGIOPLASTY     Taxus dRCA 04/2006, Xience DES mLAD and dRCA 09/2006 ([place])   CORONARY ANGIOPLASTY WITH STENT PLACEMENT  CYSTOSCOPY W/ URETERAL STENT PLACEMENT Right 08/03/2022   Procedure: CYSTOSCOPY WITH RETROGRADE PYELOGRAM/URETERAL STENT PLACEMENT;  Surgeon: Loletta Parish., MD;  Location[facility];  Service: Urology;  Laterality: Right;   EYE SURGERY Bilateral    cataracts removed- by laser    NASAL SINUS SURGERY     turbinate reduction, palate surgery  at the same time.    SHOULDER ARTHROSCOPY Left 2017   for rotator cuff x2   THYROIDECTOMY, PARTIAL  2005    MEDICATIONS:  aspirin EC 81 MG tablet   Aspirin-Acetaminophen-Caffeine (GOODYS EXTRA STRENGTH) 500-325-65 MG PACK   carvedilol (COREG) 6.25 MG tablet   clopidogrel (PLAVIX) 75 MG tablet   fenofibrate micronized (LOFIBRA) 134 MG capsule   HYDROcodone-acetaminophen (NORCO) 10-325 MG tablet   levocetirizine (XYZAL) 5 MG tablet   levothyroxine (SYNTHROID, LEVOTHROID) 50 MCG tablet   Multiple Vitamin (MULTIVITAMIN WITH MINERALS) TABS tablet   nitroGLYCERIN (NITROSTAT) 0.4 MG SL tablet   omeprazole (PRILOSEC) 40 MG capsule   ondansetron (ZOFRAN-ODT) 4 MG disintegrating tablet   OZEMPIC, 0.25 OR 0.5 MG/DOSE, 2 MG/3ML SOPN   pioglitazone (ACTOS) 30 MG tablet   ramipril (ALTACE) 10 MG capsule   rosuvastatin (CRESTOR) 40 MG tablet   sertraline (ZOLOFT) 50 MG tablet   tamsulosin (FLOMAX) 0.4 MG CAPS capsule   traZODone (DESYREL) 100 MG tablet   TRESIBA FLEXTOUCH 200 UNIT/ML FlexTouch Pen   XIGDUO XR 06-998 MG TB24   No current facility-administered medications for this encounter.   Jodell Cipro Ward, PA-C [facility] [phone]

## 2022-08-17 ENCOUNTER — Ambulatory Visit (HOSPITAL_COMMUNITY): Payer: Medicare Other

## 2022-08-17 ENCOUNTER — Ambulatory Visit (HOSPITAL_COMMUNITY)
Admission: RE | Admit: 2022-08-17 | Discharge: 2022-08-17 | Disposition: A | Discharge status: Home / Self Care | Payer: Medicare Other | Attending: Urology | Admitting: Urology

## 2022-08-17 ENCOUNTER — Encounter (HOSPITAL_COMMUNITY)
Admission: RE | Disposition: A | Discharge status: Home / Self Care | Payer: Self-pay | Source: Home / Self Care | Attending: Urology

## 2022-08-17 ENCOUNTER — Encounter (HOSPITAL_COMMUNITY): Payer: Self-pay | Admitting: Urology

## 2022-08-17 ENCOUNTER — Ambulatory Visit (HOSPITAL_COMMUNITY): Payer: Medicare Other | Admitting: Physician Assistant

## 2022-08-17 ENCOUNTER — Ambulatory Visit (HOSPITAL_BASED_OUTPATIENT_CLINIC_OR_DEPARTMENT_OTHER): Payer: Medicare Other | Admitting: Anesthesiology

## 2022-08-17 DIAGNOSIS — Z87891 Personal history of nicotine dependence: Secondary | ICD-10-CM | POA: Insufficient documentation

## 2022-08-17 DIAGNOSIS — I10 Essential (primary) hypertension: Secondary | ICD-10-CM | POA: Insufficient documentation

## 2022-08-17 DIAGNOSIS — N202 Calculus of kidney with calculus of ureter: Secondary | ICD-10-CM | POA: Diagnosis present

## 2022-08-17 DIAGNOSIS — G4733 Obstructive sleep apnea (adult) (pediatric): Secondary | ICD-10-CM | POA: Diagnosis not present

## 2022-08-17 DIAGNOSIS — E119 Type 2 diabetes mellitus without complications: Secondary | ICD-10-CM | POA: Diagnosis not present

## 2022-08-17 DIAGNOSIS — I2510 Atherosclerotic heart disease of native coronary artery without angina pectoris: Secondary | ICD-10-CM | POA: Insufficient documentation

## 2022-08-17 DIAGNOSIS — N132 Hydronephrosis with renal and ureteral calculous obstruction: Secondary | ICD-10-CM | POA: Diagnosis not present

## 2022-08-17 DIAGNOSIS — I252 Old myocardial infarction: Secondary | ICD-10-CM | POA: Insufficient documentation

## 2022-08-17 HISTORY — PX: CYSTOSCOPY WITH RETROGRADE PYELOGRAM, URETEROSCOPY AND STENT PLACEMENT: SHX5789

## 2022-08-17 HISTORY — PX: HOLMIUM LASER APPLICATION: SHX5852

## 2022-08-17 LAB — GLUCOSE, CAPILLARY
Glucose-Capillary: 98 mg/dL (ref 70–99)
Glucose-Capillary: 94 mg/dL (ref 70–99)

## 2022-08-17 SURGERY — CYSTOURETEROSCOPY, WITH RETROGRADE PYELOGRAM AND STENT INSERTION
Anesthesia: General | Laterality: Right

## 2022-08-17 MED ORDER — FENTANYL CITRATE PF 50 MCG/ML IJ SOSY
25.0000 ug | PREFILLED_SYRINGE | INTRAMUSCULAR | Status: DC | PRN
  Administered 2022-08-17: 25 ug via INTRAVENOUS
  Administered 2022-08-17: 50 ug via INTRAVENOUS

## 2022-08-17 MED ORDER — AMISULPRIDE (ANTIEMETIC) 5 MG/2ML IV SOLN
INTRAVENOUS | Status: AC
  Administered 2022-08-17: 10 mg via INTRAVENOUS
  Filled 2022-08-17: qty 4

## 2022-08-17 MED ORDER — SODIUM CHLORIDE 0.9 % IR SOLN
Status: DC | PRN
  Administered 2022-08-17 (×2): 3000 mL

## 2022-08-17 MED ORDER — CEPHALEXIN 500 MG PO CAPS: 500.0000 mg | ORAL_CAPSULE | Freq: Two times a day (BID) | ORAL | 0 refills | Status: AC

## 2022-08-17 MED ORDER — LIDOCAINE HCL (PF) 2 % IJ SOLN
INTRAMUSCULAR | Status: AC
  Filled 2022-08-17: qty 5

## 2022-08-17 MED ORDER — AMISULPRIDE (ANTIEMETIC) 5 MG/2ML IV SOLN: 10.0000 mg | Freq: Once | INTRAVENOUS | Status: AC | PRN

## 2022-08-17 MED ORDER — EPHEDRINE 5 MG/ML INJ
INTRAVENOUS | Status: AC
  Filled 2022-08-17: qty 5

## 2022-08-17 MED ORDER — OXYCODONE HCL 5 MG PO TABS
ORAL_TABLET | ORAL | Status: AC
  Filled 2022-08-17: qty 1

## 2022-08-17 MED ORDER — OXYCODONE-ACETAMINOPHEN 5-325 MG PO TABS: 1.0000 | ORAL_TABLET | Freq: Four times a day (QID) | ORAL | 0 refills | Status: AC | PRN

## 2022-08-17 MED ORDER — LIDOCAINE 2% (20 MG/ML) 5 ML SYRINGE
INTRAMUSCULAR | Status: DC | PRN
  Administered 2022-08-17: 50 mg via INTRAVENOUS

## 2022-08-17 MED ORDER — PROPOFOL 10 MG/ML IV BOLUS
INTRAVENOUS | Status: DC | PRN
  Administered 2022-08-17: 200 mg via INTRAVENOUS

## 2022-08-17 MED ORDER — LACTATED RINGERS IV SOLN: INTRAVENOUS | Status: DC | PRN

## 2022-08-17 MED ORDER — ONDANSETRON HCL 4 MG/2ML IJ SOLN
INTRAMUSCULAR | Status: AC
  Filled 2022-08-17: qty 2

## 2022-08-17 MED ORDER — IOHEXOL 300 MG/ML  SOLN
INTRAMUSCULAR | Status: DC | PRN
  Administered 2022-08-17: 15 mL

## 2022-08-17 MED ORDER — EPHEDRINE SULFATE-NACL 50-0.9 MG/10ML-% IV SOSY
PREFILLED_SYRINGE | INTRAVENOUS | Status: DC | PRN
  Administered 2022-08-17 (×2): 10 mg via INTRAVENOUS
  Administered 2022-08-17: 5 mg via INTRAVENOUS
  Administered 2022-08-17: 10 mg via INTRAVENOUS

## 2022-08-17 MED ORDER — DEXAMETHASONE SODIUM PHOSPHATE 10 MG/ML IJ SOLN
INTRAMUSCULAR | Status: AC
  Filled 2022-08-17: qty 1

## 2022-08-17 MED ORDER — FENTANYL CITRATE (PF) 100 MCG/2ML IJ SOLN
INTRAMUSCULAR | Status: AC
  Filled 2022-08-17: qty 2

## 2022-08-17 MED ORDER — KETOROLAC TROMETHAMINE 10 MG PO TABS: 10.0000 mg | ORAL_TABLET | Freq: Three times a day (TID) | ORAL | 0 refills | Status: AC | PRN

## 2022-08-17 MED ORDER — OXYCODONE HCL 5 MG PO TABS
5.0000 mg | ORAL_TABLET | Freq: Once | ORAL | Status: AC
  Administered 2022-08-17: 5 mg via ORAL

## 2022-08-17 MED ORDER — FENTANYL CITRATE PF 50 MCG/ML IJ SOSY
PREFILLED_SYRINGE | INTRAMUSCULAR | Status: AC
  Administered 2022-08-17: 25 ug via INTRAVENOUS
  Filled 2022-08-17: qty 1

## 2022-08-17 MED ORDER — MIDAZOLAM HCL 2 MG/2ML IJ SOLN
INTRAMUSCULAR | Status: DC | PRN
  Administered 2022-08-17: 2 mg via INTRAVENOUS

## 2022-08-17 MED ORDER — ONDANSETRON HCL 4 MG/2ML IJ SOLN
INTRAMUSCULAR | Status: DC | PRN
  Administered 2022-08-17: 4 mg via INTRAVENOUS

## 2022-08-17 MED ORDER — PROPOFOL 10 MG/ML IV BOLUS
INTRAVENOUS | Status: AC
  Filled 2022-08-17: qty 20

## 2022-08-17 MED ORDER — ACETAMINOPHEN 500 MG PO TABS
1000.0000 mg | ORAL_TABLET | Freq: Once | ORAL | Status: AC
  Administered 2022-08-17: 1000 mg via ORAL
  Filled 2022-08-17: qty 2

## 2022-08-17 MED ORDER — INSULIN ASPART 100 UNIT/ML IJ SOLN: 0.0000 [IU] | INTRAMUSCULAR | Status: DC | PRN

## 2022-08-17 MED ORDER — MIDAZOLAM HCL 2 MG/2ML IJ SOLN
INTRAMUSCULAR | Status: AC
  Filled 2022-08-17: qty 2

## 2022-08-17 MED ORDER — FENTANYL CITRATE PF 50 MCG/ML IJ SOSY
PREFILLED_SYRINGE | INTRAMUSCULAR | Status: AC
  Filled 2022-08-17: qty 1

## 2022-08-17 MED ORDER — DEXAMETHASONE SODIUM PHOSPHATE 10 MG/ML IJ SOLN
INTRAMUSCULAR | Status: DC | PRN
  Administered 2022-08-17: 4 mg via INTRAVENOUS

## 2022-08-17 MED ORDER — GENTAMICIN SULFATE 40 MG/ML IJ SOLN
5.0000 mg/kg | INTRAVENOUS | Status: AC
  Administered 2022-08-17: 450 mg via INTRAVENOUS
  Filled 2022-08-17: qty 11.25

## 2022-08-17 MED ORDER — FENTANYL CITRATE (PF) 100 MCG/2ML IJ SOLN
INTRAMUSCULAR | Status: DC | PRN
  Administered 2022-08-17: 100 ug via INTRAVENOUS

## 2022-08-17 SURGICAL SUPPLY — 23 items
BAG URO CATCHER STRL LF (MISCELLANEOUS) ×1 IMPLANT
BASKET LASER NITINOL 1.9FR (BASKET) IMPLANT
BSKT STON RTRVL 120 1.9FR (BASKET) ×1
CATH URETL OPEN END 6FR 70 (CATHETERS) ×1 IMPLANT
CLOTH BEACON ORANGE TIMEOUT ST (SAFETY) ×1 IMPLANT
EXTRACTOR STONE 1.7FRX115CM (UROLOGICAL SUPPLIES) IMPLANT
GLOVE SURG LX STRL 7.5 STRW (GLOVE) ×1 IMPLANT
GOWN STRL REUS W/ TWL XL LVL3 (GOWN DISPOSABLE) ×1 IMPLANT
GOWN STRL REUS W/TWL XL LVL3 (GOWN DISPOSABLE) ×1
GUIDEWIRE ANG ZIPWIRE 038X150 (WIRE) ×1 IMPLANT
GUIDEWIRE STR DUAL SENSOR (WIRE) ×1 IMPLANT
KIT TURNOVER KIT A (KITS) IMPLANT
LASER FIB FLEXIVA PULSE ID 365 (Laser) IMPLANT
MANIFOLD NEPTUNE II (INSTRUMENTS) ×1 IMPLANT
PACK CYSTO (CUSTOM PROCEDURE TRAY) ×1 IMPLANT
SHEATH NAVIGATOR HD 11/13X28 (SHEATH) IMPLANT
SHEATH NAVIGATOR HD 11/13X36 (SHEATH) IMPLANT
STENT POLARIS 5FRX26 (STENTS) IMPLANT
TRACTIP FLEXIVA PULS ID 200XHI (Laser) IMPLANT
TRACTIP FLEXIVA PULSE ID 200 (Laser) ×1
TUBE PU 8FR 16IN ENFIT (TUBING) ×1 IMPLANT
TUBING CONNECTING 10 (TUBING) ×1 IMPLANT
TUBING UROLOGY SET (TUBING) ×1 IMPLANT

## 2022-08-17 NOTE — Discharge Instructions (Signed)
1 - You may have urinary urgency (bladder spasms) and bloody urine on / off with stent in place. This is normal. ° °2 - Call MD or go to ER for fever >102, severe pain / nausea / vomiting not relieved by medications, or acute change in medical status ° °

## 2022-08-17 NOTE — Anesthesia Postprocedure Evaluation (Signed)
Anesthesia Post Note  Patient: MATTIE BUDZYNSKI  Procedure(s) Performed: CYSTOSCOPY WITH RETROGRADE PYELOGRAM, URETEROSCOPY AND STENT EXCHANGE (Right) HOLMIUM LASER APPLICATION (Right)     Patient location during evaluation: PACU Anesthesia Type: General Level of consciousness: awake and alert Pain management: pain level controlled Vital Signs Assessment: post-procedure vital signs reviewed and stable Respiratory status: spontaneous breathing, nonlabored ventilation, respiratory function stable and patient connected to nasal cannula oxygen Cardiovascular status: blood pressure returned to baseline and stable Postop Assessment: no apparent nausea or vomiting Anesthetic complications: no  No notable events documented.  Last Vitals:  Vitals:   08/17/22 1315 08/17/22 1330  BP: (!) 157/82 (!) 144/81  Pulse: (!) 55 (!) 56  Resp: 17 14  Temp:  36.6 C  SpO2: 93% 95%    Last Pain:  Vitals:   08/17/22 1330  TempSrc:   PainSc: 4                  Kennieth Rad

## 2022-08-17 NOTE — Anesthesia Procedure Notes (Signed)
Procedure Name: LMA Insertion Date/Time: 08/17/2022 10:11 AM  Performed by: Nelle Don, CRNAPre-anesthesia Checklist: Patient identified, Emergency Drugs available, Suction available and Patient being monitored Patient Re-evaluated:Patient Re-evaluated prior to induction Oxygen Delivery Method: Circle system utilized Preoxygenation: Pre-oxygenation with 100% oxygen Induction Type: IV induction LMA: LMA with gastric port inserted LMA Size: 4.0 Number of attempts: 1 Dental Injury: Teeth and Oropharynx as per pre-operative assessment

## 2022-08-17 NOTE — H&P (Signed)
Douglas Aguirre is an 72 y.o. male.    Chief Complaint: Pre-OP RIGHT Ureteroscopic Stone Manipulation  HPI:   1 - Recurrent Urolithiasis -  Pre 2024 - SWL x few, MET x few 07/2022 - 13mm ovoid Rt proximal ureteral stone, few punctate Rt renal stones on eval flank pain ==R Rt JJ stent placed   2- Medical Stone Disease-  Eval 2024 :BMP, PTH, Urate - pending; Composition - pending; 24 Hr Urines pending   PMH sig for CAD/MI/Stent/Plavix (follows [facility]), obesity, IDDM2, benign thyroid surgery.    Today "Douglas Aguirre" is seen to proceed with RIGHT ureteroscopic stone manipulation. No interval fevers. Most recent UA without infectious parameters.   Past Medical History:  Diagnosis Date   Anxiety    Arthritis    degenerative joints, back- upper & lower    Cancer (HCC)    Coronary artery disease    Depression    Diabetes mellitus without complication (HCC)    Diabetes mellitus, type 2 (HCC)    GERD (gastroesophageal reflux disease)    Herniated cervical disc 06/21/2016   History of hiatal hernia    History of kidney stones    passed spontaneously, & has cystoscopy & lithotripsy   Hypertension    Hypothyroidism    Myocardial infarction [facility])    Neuromuscular disorder (HCC)    carpal tunnel syndrome - both hands    OSA on CPAP    Sleep apnea 2013   last study- cpap    Past Surgical History:  Procedure Laterality Date   ANTERIOR CERVICAL DECOMP/DISCECTOMY FUSION N/A 06/21/2016   Procedure: Cervical Six-Seven Anterior cervical decompression/discectomy/fusion; LEFT CARPAL TUNNEL RELEASE;  Surgeon: Maeola Harman, MD;  Location: [facility] OR;  Service: Neurosurgery;  Laterality: N/A;  left side approach   CARPAL TUNNEL RELEASE Right 2017   CARPAL TUNNEL RELEASE Left 06/21/2016   Procedure: LEFT CARPAL TUNNEL RELEASE;  Surgeon: Maeola Harman, MD;  Location: [facility] OR;  Service: Neurosurgery;  Laterality: Left;  LEFT    CARPAL TUNNEL RELEASE Right 08/19/2016   Procedure: RIGHT CARPAL TUNNEL  RELEASE;  Surgeon: Maeola Harman, MD;  Location: [facility] OR;  Service: Neurosurgery;  Laterality: Right;  RIGHT CARPAL TUNNEL RELEASE   CORONARY ANGIOPLASTY     Taxus dRCA 04/2006, Xience DES mLAD and dRCA 09/2006 ([place])   CORONARY ANGIOPLASTY WITH STENT PLACEMENT     CYSTOSCOPY W/ URETERAL STENT PLACEMENT Right 08/03/2022   Procedure: CYSTOSCOPY WITH RETROGRADE PYELOGRAM/URETERAL STENT PLACEMENT;  Surgeon: Loletta Parish., MD;  [facility];  Service: Urology;  Laterality: Right;   EYE SURGERY Bilateral    cataracts removed- by laser    NASAL SINUS SURGERY     turbinate reduction, palate surgery at the same time.    SHOULDER ARTHROSCOPY Left 2017   for rotator cuff x2   THYROIDECTOMY, PARTIAL  2005    No family history on file. Social History:  reports that he quit smoking about 11 years ago. His smoking use included cigarettes. He has never used smokeless tobacco. He reports that he does not drink alcohol and does not use drugs.  Allergies:  Allergies  Allergen Reactions   Alpha-Gal Other (See Comments)   Niacin Other (See Comments)    Redness and burning    No medications prior to admission.    No results found for this or any previous visit (from the past 48 hour(s)). No results found.  Review of Systems  Constitutional:  Negative for chills and fever.  Genitourinary:  Positive for urgency.  All other systems reviewed and are negative.   There were no vitals taken for this visit. Physical Exam HENT:     Head: Normocephalic.     Nose: Nose normal.  Eyes:     Pupils: Pupils are equal, round, and reactive to light.  Cardiovascular:     Rate and Rhythm: Normal rate.  Abdominal:     Comments: Stable truncal obesity  Genitourinary:    Comments: No CVAT at present Musculoskeletal:        General: Normal range of motion.     Cervical back: Normal range of motion.  Skin:    General: Skin is warm.  Neurological:     General: No focal deficit present.      Mental Status: He is alert.  Psychiatric:        Mood and Affect: Mood normal.      Assessment/Plan  Proceed as planned with RIGHT ureteroscopic stone manipulation with goal of stone free. Risks, benefits, alternatives, expected peri-op course discussed previously and reiterated today.  Loletta Parish., MD 08/17/2022, 6:39 AM

## 2022-08-17 NOTE — Transfer of Care (Signed)
Immediate Anesthesia Transfer of Care Note  Patient: Douglas Aguirre  Procedure(s) Performed: CYSTOSCOPY WITH RETROGRADE PYELOGRAM, URETEROSCOPY AND STENT EXCHANGE (Right) HOLMIUM LASER APPLICATION (Right)  Patient Location: PACU  Anesthesia Type:General  Level of Consciousness: awake, alert , and oriented  Airway & Oxygen Therapy: Patient Spontanous Breathing and Patient connected to face mask oxygen  Post-op Assessment: Report given to RN, Post -op Vital signs reviewed and stable, and Patient moving all extremities X 4  Post vital signs: Reviewed and stable  Last Vitals:  Vitals Value Taken Time  BP 149/106   Temp    Pulse 57   Resp 12   SpO2 98     Last Pain:  Vitals:   08/17/22 0839  TempSrc:   PainSc: 6          Complications: No notable events documented.

## 2022-08-17 NOTE — Op Note (Signed)
NAMEJAVONN, LENHOFF MEDICAL RECORD NO: [id number] ACCOUNT NO: [id number] DATE OF BIRTH: 03/19/1950 FACILITY: [facility] LOCATION: [facility] PHYSICIAN: Sebastian Ache, MD  Operative Report   DATE OF PROCEDURE: 08/17/2022  PREOPERATIVE DIAGNOSIS:  Large right ureteral stone, small renal stone.  PROCEDURE PERFORMED: 1.  Cystoscopy with right retrograde pyelogram interpretation. 2.  Right ureteroscopy with laser lithotripsy. 3.  Exchange of right ureteral stent.  ESTIMATED BLOOD LOSS:  Nil.  COMPLICATIONS:  None.  SPECIMEN:  Right renal and ureteral stone fragments for composition analysis.  FINDINGS: 1.  Very large and somewhat impacted right proximal ureteral stone over a centimeter. 2.  Small right lower pole renal stone. 3.  Complete resolution of all accessible stone fragments larger than one-third mm following laser lithotripsy and basket retraction. 4.  Successful replacement of right ureteral stent, proximal end in the renal pelvis, distal end in urinary bladder with tether catheter.  INDICATIONS:  The patient is a 72 year old man who was found on workup of colicky flank pain and several months of nausea to have a very large right proximal ureteral stone.  This was over a centimeter.  His colic was quite difficult to control.  He  underwent stenting as a temporizing measure several weeks ago.  Options were discussed for further management including ureteroscopy with goal of stone free versus shockwave lithotripsy.  He elected for goal of stone free with ureteroscopy.   He presents for this  today.  Informed consent was obtained and placed in medical record.  PROCEDURE IN DETAIL:  The patient being Esko Imparato.  Procedure being right ureteroscopic stone manipulation was confirmed.  Procedure timeout was performed.  Intravenous antibiotics were administered.  General LMA anesthesia induced, placed into a low  lithotomy position.  Sterile field was created, prepped and draped the  patient's penis, perineum, and proximal thighs using iodine.  Cystourethroscopy was performed using 21-French rigid cystoscope with offset lens.  Inspection of anterior and posterior  urethra did reveal some moderately bilobar prostatic hypertrophy.  Distal end of the right stent was grasped, brought to the level of the urethral meatus.  Distal 0.038 ZIPwire was advanced to the lower ureter exchange of Foley catheter and right  retrograde pyelogram was obtained.  Right retrograde pyelogram demonstrated single right ureter, single system right kidney.  There was a large ovoid filling defect in the proximal ureter consistent with known stone.  The ZIPwire was once again advanced to the upper poles as a safety wire.   An 8-French feeding tube placed in the urinary bladder for pressure release and semirigid ureteroscopy performed of the distal right ureter alongside a separate sensor working wire to the upper reaches of the semirigid scope, the stone in question was  just barely visualized and a semirigid scope was exchanged for a medium length ureteral access sheath using continuous fluoroscopic guidance assisting the sheath to just approximately 2 cm inferior to the stone.  Next flexible digital ureteroscopy was  performed of the right ureter.  This revealed excellent sheath placement.  Anticipate approximately 2 cm below the stone.  Stone was much too large for simple basketing.  As such, holmium laser energy applied to the stone using a setting of 0.2 joules  and 20 Hz and approximately 50% of the stone was dusted, 50% fragmented with fragments then being amenable to basketing with Escape basket, stone was quite large and somewhat impacted, but fortunately after removal of this, the ureter was without  significant trauma but there was mucosal edema as  anticipated.  Inspection of the kidney and all calices x3 did reveal 1 additional calcification in the lower pole calix.  This was amenable to simple  basketing.  Following this, the access sheath was  removed under continuous vision, no significant mucosal abnormalities found.  Given the very large nature of the stone, I felt that interval stenting with tethered stent would be most prudent.  As such, a new primary 5 x 26 Polaris type stent was  carefully placed using fluoroscopic guidance and proximal and distal planes were noted.  Upon withdrawlel of scopes there was significant urethral bleeding from the distal urethra, likely just due to instrumentation.  This did remain somewhat problematic despite some pressure, decision  was made to place a large catheter for tamponade several days.  As such, a 24-French Foley catheter was placed free to straight drain, 10 mL water were instilled in balloon.  This provided excellent tamponade to the area of the urethral bleeding and the  stent tether was actually fashioned to this, inserted catheter in, stent can be removed together later this week.   SHY D: 08/17/2022 11:35:01 am T: 08/17/2022 12:46:00 pm  JOB: [id number]/ [id number]

## 2022-08-17 NOTE — Brief Op Note (Signed)
08/17/2022  11:28 AM  PATIENT:  Douglas Aguirre  72 y.o. male  PRE-OPERATIVE DIAGNOSIS:  RIGHT URETERAL STONE  POST-OPERATIVE DIAGNOSIS:  RIGHT URETERAL STONE  PROCEDURE:  Procedure(s) with comments: CYSTOSCOPY WITH RETROGRADE PYELOGRAM, URETEROSCOPY AND STENT EXCHANGE (Right) - 90 MINS HOLMIUM LASER APPLICATION (Right)  SURGEON:  Surgeon(s) and Role:    * Zohair Epp, Delbert Phenix., MD - Primary  PHYSICIAN ASSISTANT:   ASSISTANTS: none   ANESTHESIA:   general  EBL:  minimal   BLOOD ADMINISTERED:none  DRAINS:  Foley to gravity    LOCAL MEDICATIONS USED:  NONE  SPECIMEN:  Source of Specimen:  Rt renal / ureteral stone fragments  DISPOSITION OF SPECIMEN:   [facility] for compositional analysis  COUNTS:  YES  TOURNIQUET:  * No tourniquets in log *  DICTATION: .Other Dictation: Dictation Number [id number]  PLAN OF CARE: Discharge to home after PACU  PATIENT DISPOSITION:  PACU - hemodynamically stable.   Delay start of Pharmacological VTE agent (>24hrs) due to surgical blood loss or risk of bleeding: yes

## 2022-08-18 ENCOUNTER — Encounter (HOSPITAL_COMMUNITY): Payer: Self-pay | Admitting: Urology

## 2023-06-29 ENCOUNTER — Other Ambulatory Visit: Payer: Self-pay

## 2023-06-29 ENCOUNTER — Emergency Department (HOSPITAL_COMMUNITY)

## 2023-06-29 ENCOUNTER — Emergency Department (HOSPITAL_COMMUNITY)
Admission: EM | Admit: 2023-06-29 | Discharge: 2023-06-29 | Disposition: A | Discharge status: Home / Self Care | Attending: Emergency Medicine | Admitting: Emergency Medicine

## 2023-06-29 ENCOUNTER — Encounter (HOSPITAL_COMMUNITY): Payer: Self-pay | Admitting: *Deleted

## 2023-06-29 DIAGNOSIS — M545 Low back pain, unspecified: Secondary | ICD-10-CM | POA: Diagnosis present

## 2023-06-29 DIAGNOSIS — I2510 Atherosclerotic heart disease of native coronary artery without angina pectoris: Secondary | ICD-10-CM | POA: Insufficient documentation

## 2023-06-29 DIAGNOSIS — N400 Enlarged prostate without lower urinary tract symptoms: Secondary | ICD-10-CM | POA: Insufficient documentation

## 2023-06-29 DIAGNOSIS — E039 Hypothyroidism, unspecified: Secondary | ICD-10-CM | POA: Insufficient documentation

## 2023-06-29 DIAGNOSIS — I700 Atherosclerosis of aorta: Secondary | ICD-10-CM | POA: Insufficient documentation

## 2023-06-29 DIAGNOSIS — Z859 Personal history of malignant neoplasm, unspecified: Secondary | ICD-10-CM | POA: Insufficient documentation

## 2023-06-29 DIAGNOSIS — I10 Essential (primary) hypertension: Secondary | ICD-10-CM | POA: Diagnosis not present

## 2023-06-29 DIAGNOSIS — M47816 Spondylosis without myelopathy or radiculopathy, lumbar region: Secondary | ICD-10-CM | POA: Insufficient documentation

## 2023-06-29 DIAGNOSIS — M47814 Spondylosis without myelopathy or radiculopathy, thoracic region: Secondary | ICD-10-CM | POA: Insufficient documentation

## 2023-06-29 DIAGNOSIS — Z87891 Personal history of nicotine dependence: Secondary | ICD-10-CM | POA: Diagnosis not present

## 2023-06-29 DIAGNOSIS — E119 Type 2 diabetes mellitus without complications: Secondary | ICD-10-CM | POA: Insufficient documentation

## 2023-06-29 LAB — BASIC METABOLIC PANEL WITH GFR
Anion gap: 9 (ref 5–15)
BUN: 21 mg/dL (ref 8–23)
CO2: 25 mmol/L (ref 22–32)
Calcium: 9 mg/dL (ref 8.9–10.3)
Chloride: 103 mmol/L (ref 98–111)
Creatinine, Ser: 1.33 mg/dL — ABNORMAL HIGH (ref 0.61–1.24)
GFR, Estimated: 58 mL/min — ABNORMAL LOW (ref >60)
Glucose, Bld: 154 mg/dL — ABNORMAL HIGH (ref 70–99)
Potassium: 4.5 mmol/L (ref 3.5–5.1)
Sodium: 137 mmol/L (ref 135–145)

## 2023-06-29 LAB — URINALYSIS, ROUTINE W REFLEX MICROSCOPIC
Bilirubin Urine: NEGATIVE
Glucose, UA: >=500 mg/dL — AB
Hgb urine dipstick: NEGATIVE
Ketones, ur: NEGATIVE mg/dL
Leukocytes,Ua: NEGATIVE
Nitrite: NEGATIVE
Protein, ur: NEGATIVE mg/dL
Specific Gravity, Urine: 1.024 (ref 1.005–1.030)
pH: 5 (ref 5.0–8.0)

## 2023-06-29 LAB — CBC
HCT: 40.8 % (ref 39.0–52.0)
Hemoglobin: 13.3 g/dL (ref 13.0–17.0)
MCH: 30.5 pg (ref 26.0–34.0)
MCHC: 32.6 g/dL (ref 30.0–36.0)
MCV: 93.6 fL (ref 80.0–100.0)
Platelets: 220 10*3/uL (ref 150–400)
RBC: 4.36 MIL/uL (ref 4.22–5.81)
RDW: 14 % (ref 11.5–15.5)
WBC: 7.1 10*3/uL (ref 4.0–10.5)
nRBC: 0 % (ref 0.0–0.2)

## 2023-06-29 MED ORDER — ONDANSETRON 4 MG PO TBDP
4.0000 mg | ORAL_TABLET | Freq: Once | ORAL | Status: AC
  Administered 2023-06-29: 4 mg via ORAL
  Filled 2023-06-29: qty 1

## 2023-06-29 MED ORDER — GADOBUTROL 1 MMOL/ML IV SOLN
10.0000 mL | Freq: Once | INTRAVENOUS | Status: AC | PRN
  Administered 2023-06-29: 10 mL via INTRAVENOUS

## 2023-06-29 MED ORDER — TIZANIDINE HCL 4 MG PO TABS: 2.0000 mg | ORAL_TABLET | Freq: Three times a day (TID) | ORAL | 0 refills | Status: AC | PRN

## 2023-06-29 MED ORDER — HYDROMORPHONE HCL 2 MG PO TABS
2.0000 mg | ORAL_TABLET | Freq: Once | ORAL | Status: AC
  Administered 2023-06-29: 2 mg via ORAL
  Filled 2023-06-29: qty 1

## 2023-06-29 MED ORDER — HYDROMORPHONE HCL 1 MG/ML IJ SOLN
1.0000 mg | Freq: Once | INTRAMUSCULAR | Status: AC
  Administered 2023-06-29: 1 mg via INTRAMUSCULAR
  Filled 2023-06-29: qty 1

## 2023-06-29 MED ORDER — LIDOCAINE 5 % EX PTCH
1.0000 | MEDICATED_PATCH | Freq: Once | CUTANEOUS | Status: DC
  Administered 2023-06-29: 1 via TRANSDERMAL
  Filled 2023-06-29: qty 1

## 2023-06-29 MED ORDER — TIZANIDINE HCL 4 MG PO TABS
2.0000 mg | ORAL_TABLET | Freq: Once | ORAL | Status: AC
  Administered 2023-06-29: 2 mg via ORAL
  Filled 2023-06-29: qty 1

## 2023-06-29 MED ORDER — LIDOCAINE 5 % EX PTCH: 1.0000 | MEDICATED_PATCH | CUTANEOUS | 0 refills | Status: AC

## 2023-06-29 MED ORDER — OXYCODONE-ACETAMINOPHEN 5-325 MG PO TABS
2.0000 | ORAL_TABLET | Freq: Once | ORAL | Status: AC
  Administered 2023-06-29: 2 via ORAL
  Filled 2023-06-29: qty 2

## 2023-06-29 NOTE — ED Provider Triage Note (Signed)
 Emergency Medicine Provider Triage Evaluation Note  Douglas Aguirre , a 73 y.o. male  was evaluated in triage.  Pt complains of back pain, history of chronic back pain on Lortab.  Not having severe right sided back pain does not radiate.  History of kidney stones as well worse with position change.  No injuries  Review of Systems  Positive: Back pain Negative: Weakness  Physical Exam  BP (!) 102/56 (BP Location: Right Arm)   Pulse (!) 58   Temp (!) 97.4 F (36.3 C)   Resp (!) 24   SpO2 95%  Gen:   Awake, no distress   Resp:  Normal effort  MSK:   Moves extremities without difficulty  Other:    Medical Decision Making  Medically screening exam initiated at 1:49 PM.  Appropriate orders placed.  Douglas Aguirre was informed that the remainder of the evaluation will be completed by another provider, this initial triage assessment does not replace that evaluation, and the importance of remaining in the ED until their evaluation is complete.     Tama Fails, PA-C 06/29/23 1350

## 2023-06-29 NOTE — ED Notes (Signed)
 Pt given something to eat per provider ok.

## 2023-06-29 NOTE — ED Notes (Signed)
 Pt resting more comfortably. No needs voiced.

## 2023-06-29 NOTE — ED Triage Notes (Signed)
 Pt is here for lower back pain which is sharp and increases with changing positions.

## 2023-06-29 NOTE — ED Notes (Signed)
 Pt taken to MRI

## 2023-06-29 NOTE — ED Provider Notes (Signed)
 [facility] AT [facility] Provider Note  History  Chief Complaint:  Back Pain   Back Pain Location:  Lumbar spine Pain severity:  Moderate Onset quality:  Gradual Duration:  7 days Timing:  Intermittent Progression:  Waxing and waning Chronicity:  Chronic Associated symptoms: no bladder incontinence, no bowel incontinence, no fever, no perianal numbness, no tingling and no weakness   Risk factors comment:  Hx chronic back pain    Douglas Aguirre is a 73 y.o. male with a history of diabetes, CAD who presents the emergency department for nontraumatic right paralumbar back pain.  Past Medical History:  Diagnosis Date   Anxiety    Arthritis    degenerative joints, back- upper & lower    Cancer (HCC)    Coronary artery disease    Depression    Diabetes mellitus without complication (HCC)    Diabetes mellitus, type 2 (HCC)    GERD (gastroesophageal reflux disease)    Herniated cervical disc 06/21/2016   History of hiatal hernia    History of kidney stones    passed spontaneously, & has cystoscopy & lithotripsy   Hypertension    Hypothyroidism    Myocardial infarction [facility])    Neuromuscular disorder (HCC)    carpal tunnel syndrome - both hands    OSA on CPAP    Sleep apnea 2013   last study- cpap    Past Surgical History:  Procedure Laterality Date   ANTERIOR CERVICAL DECOMP/DISCECTOMY FUSION N/A 06/21/2016   Procedure: Cervical Six-Seven Anterior cervical decompression/discectomy/fusion; LEFT CARPAL TUNNEL RELEASE;  Surgeon: Manya Sells, MD;  Location: [facility] OR;  Service: Neurosurgery;  Laterality: N/A;  left side approach   CARPAL TUNNEL RELEASE Right 2017   CARPAL TUNNEL RELEASE Left 06/21/2016   Procedure: LEFT CARPAL TUNNEL RELEASE;  Surgeon: Manya Sells, MD;  Location: [facility] OR;  Service: Neurosurgery;  Laterality: Left;  LEFT    CARPAL TUNNEL RELEASE Right 08/19/2016   Procedure: RIGHT CARPAL TUNNEL RELEASE;  Surgeon: Manya Sells,  MD;  Location: [facility] OR;  Service: Neurosurgery;  Laterality: Right;  RIGHT CARPAL TUNNEL RELEASE   CORONARY ANGIOPLASTY     Taxus dRCA 04/2006, Xience DES mLAD and dRCA 09/2006 ([place])   CORONARY ANGIOPLASTY WITH STENT PLACEMENT     CYSTOSCOPY W/ URETERAL STENT PLACEMENT Right 08/03/2022   Procedure: CYSTOSCOPY WITH RETROGRADE PYELOGRAM/URETERAL STENT PLACEMENT;  Surgeon: Melody Spurling., MD;  Location: [facility];  Service: Urology;  Laterality: Right;   CYSTOSCOPY WITH RETROGRADE PYELOGRAM, URETEROSCOPY AND STENT PLACEMENT Right 08/17/2022   Procedure: CYSTOSCOPY WITH RETROGRADE PYELOGRAM, URETEROSCOPY AND STENT EXCHANGE;  Surgeon: Melody Spurling., MD;  Location: [facility];  Service: Urology;  Laterality: Right;  90 MINS   EYE SURGERY Bilateral    cataracts removed- by laser    HOLMIUM LASER APPLICATION Right 08/17/2022   Procedure: HOLMIUM LASER APPLICATION;  Surgeon: Melody Spurling., MD;  Location: [facility];  Service: Urology;  Laterality: Right;   NASAL SINUS SURGERY     turbinate reduction, palate surgery at the same time.    SHOULDER ARTHROSCOPY Left 2017   for rotator cuff x2   THYROIDECTOMY, PARTIAL  2005    History reviewed. No pertinent family history.  Social History   Tobacco Use   Smoking status: Former    Current packs/day: 0.00    Types: Cigarettes    Quit date: 06/16/2011    Years since quitting: 12.0   Smokeless tobacco: Never  Vaping  Use   Vaping status: Never Used  Substance Use Topics   Alcohol use: No   Drug use: No    Review of Systems  Review of Systems  Constitutional:  Negative for fever.  Gastrointestinal:  Negative for bowel incontinence.  Genitourinary:  Negative for bladder incontinence.  Musculoskeletal:  Positive for back pain.  Neurological:  Negative for tingling and weakness.     Reviewed and documented in HPI if pertinent.   Physical Exam   ED Triage Vitals  Encounter Vitals Group     BP 06/29/23 1343 (!) 102/56      Systolic BP Percentile --      Diastolic BP Percentile --      Pulse Rate 06/29/23 1343 (!) 58     Resp 06/29/23 1343 (!) 24     Temp 06/29/23 1343 (!) 97.4 F (36.3 C)     Temp src --      SpO2 06/29/23 1343 95 %     Weight --      Height --      Head Circumference --      Peak Flow --      Pain Score 06/29/23 1346 7     Pain Loc --      Pain Education --      Exclude from Growth Chart --      Physical Exam Vitals and nursing note reviewed.  Constitutional:      General: He is not in acute distress.    Appearance: He is well-developed.  HENT:     Head: Normocephalic and atraumatic.  Eyes:     Conjunctiva/sclera: Conjunctivae normal.  Cardiovascular:     Rate and Rhythm: Normal rate and regular rhythm.     Heart sounds: No murmur heard. Pulmonary:     Effort: Pulmonary effort is normal. No respiratory distress.     Breath sounds: Normal breath sounds.  Abdominal:     Palpations: Abdomen is soft.     Tenderness: There is no abdominal tenderness.  Musculoskeletal:        General: No swelling.     Cervical back: Neck supple. No bony tenderness.     Thoracic back: No bony tenderness.     Lumbar back: No bony tenderness.       Back:     Comments: R paralumbar muscle tenderness in the area above; does not have pelvic bone tenderness  Skin:    General: Skin is warm and dry.     Capillary Refill: Capillary refill takes less than 2 seconds.  Neurological:     Mental Status: He is alert.  Psychiatric:        Mood and Affect: Mood normal.     Neuro Exam  Mental Status Exam Alert and oriented Memory appropriate  Speech Speech is clear No dysarthria Language is normal  Cranial Nerves CN II: Visual fields intact to confrontation CN III, IV, VI: PERRL, EOMI, No nystagmus CN V: Facial sensation is normal and equal CN VII: No facial weakness or asymmetry CN VIII: Auditory acuity grossly normal CN IX and X: Uvula is midline, palate elevates symmetrically CN XI:  Normal sternocleidomastoid and equal strength CN XII: The tongue is midline, no tongue atrophy or fasciculations  Motor Muscle Strength RUE: 5/5 flexion and extension RLE: 5/5 flexion and extension LUE: 5/5 flexion and extension LLE: 5/5 flexion and extension No pronation or drift  Muscle Tone Normal bulk and tone  Coordination Intact heel-to-shin No tremor  Sensation Intact  to light touch  Gait Routine gait normal   Procedures   Procedures  ED Course - Medical Decision Making  Brief Overview Douglas Aguirre is a 73 y.o. male who presents as per above.  I have reviewed the nursing documentation for past medical history, family history, and social history and agree.  I have reviewed the patient's vital signs. There are no abnormalities.  Initial Differential Diagnoses: I am primarily concerned for MSK sprain, nephrolithiasis, AKI, UTI, pyelonephritis.  Therapies: These medications and interventions were provided for the patient while in the ED.  Medications  tiZANidine  (ZANAFLEX ) tablet 2 mg (has no administration in time range)  oxyCODONE -acetaminophen  (PERCOCET/ROXICET) 5-325 MG per tablet 2 tablet (2 tablets Oral Given 06/29/23 1405)  ondansetron  (ZOFRAN -ODT) disintegrating tablet 4 mg (4 mg Oral Given 06/29/23 1406)    Testing Results: On my interpretation labs are significant for : No leukocytosis No electrolyte abnormalities  On my interpretation imaging is significant for: CT stone study reveals no evidence of nephrolithiasis  See the EMR for full details regarding lab and imaging results.   Medical Decision Making Douglas Aguirre is a 73 y.o. male who presents with back pain as per above.  On exam patient has 5-5 strength in all 4 extremities.  He has 2+ radial pulses bilaterally and 2+ PT pulses bilaterally.  Intact sensation all 4 extremities.  His gait is intact.  He has no midline C, T or L-spine tenderness.  Does have tenderness over the right  paralumbar musculature.    The patient is able to ambulate and is hemodynamically stable. There are no red flag symptoms. Specifically, denies: -Being on an anticoagulant or blood thinner -Using IV drugs -Having a history of AAA -Having saddle anesthesia -Having urinary or fecal incontinence -Having any recent falls or trauma  I do not think that Douglas Aguirre is experiencing cauda equina syndrome, abdominal aortic aneurysm, epidural abscess, aortic dissection, spinal hematoma, nephrolithiasis, discitis, or an acute fracture.  Laboratory studies are unremarkable.  No electrolyte abnormalities.  Baseline CKD.  CT reviewed which has no concerns for nephrolithiasis.  Does have degenerative changes in the thoracic and lumbar spine.  No fracture or osseous lesions.  Given history of prostate cancer we will pursue MRI lumbar spine at this time due to concern for osseous involvement causing severe pain.  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  There is no right sided lesions or stenosis.  Therefore do not feel that a urgent spine consultation is indicated.  This likely represents MSK pain.  Short course of Zanaflex  given to the patient.  Lidocaine  patch ordered. Patient discharged in stable condition to follow up with PCP and spine surgery.  Problems Addressed: Acute right-sided low back pain without sciatica: complicated acute illness or injury that poses a threat to life or bodily functions  Amount and/or Complexity of Data Reviewed Radiology: ordered.  Risk Prescription drug management.     ### All radiography studies, electrocardiograms, and laboratory data were personally reviewed by me and incorporated into my medical decision making. Impression   1. Acute right-sided low back pain without sciatica      Note: Dragon medical dictation software was used in the creation of this note.     Douglas Landmark, MD 06/29/23 4696    Douglas Lee,  MD 07/04/23 [phone]

## 2023-06-29 NOTE — Discharge Instructions (Addendum)
 Douglas Aguirre:  Thank you for allowing us  to take care of you today.  We hope you begin feeling better soon. You were seen today for R sided back pain.  CT revealed no signs of nephrolithiasis/kidney stones.  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  You should see your spine surgeon regarding this.  Given that is the left side it does not explain your symptoms today.  Likely this is a MSK strain.  To-Do:  Please follow-up with your primary doctor within the next 2-3 days. It is important that you review any labs or imaging results (if any) that you had today with them. Your preliminary imaging results (if any) are attached. Please return to the Emergency Department or call 911 if you experience chest pain, shortness of breath, severe pain, severe fever, altered mental status, or have any reason to think that you need emergency medical care.  Thank you again.  Hope you feel better soon.  Arminda Landmark, MD Department of Emergency Medicine

## 2023-09-29 ENCOUNTER — Ambulatory Visit: Admitting: Podiatry

## 2023-09-29 DIAGNOSIS — L600 Ingrowing nail: Secondary | ICD-10-CM

## 2023-09-29 NOTE — Progress Notes (Signed)
 Subjective:   Patient ID: Douglas Aguirre, male   DOB: 73 y.o.   MRN: [id number]   HPI Patient presents concerned about nail discoloration nail thickness with patient being a diabetic and what to make sure there is no issues   ROS      Objective:  Physical Exam  Neurovascular status found to be intact moderate obesity noted with patient found to have discoloration of the hallux nail left over right with some nail beds that are moderately thickened but no other pathology with patient under reasonable control     Assessment:  Probability for trauma to the nailbeds versus fungus but cannot rule out fungal infection     Plan:  H&P reviewed conditions discussed nail disease versus ingrown toenail and at this point we are just gena keep an eye on this and I explained trauma to the nailbeds which can occur.  Patient will be seen back to recheck
